# Patient Record
Sex: Male | Born: 1980 | Race: Black or African American | Hispanic: No | Smoking: Current every day smoker
Health system: Southern US, Community
[De-identification: ages and names within clinical notes are randomized; demographics above are authoritative.]

## PROBLEM LIST (undated history)

## (undated) DIAGNOSIS — Z8673 Personal history of transient ischemic attack (TIA), and cerebral infarction without residual deficits: Secondary | ICD-10-CM

## (undated) DIAGNOSIS — Z8669 Personal history of other diseases of the nervous system and sense organs: Secondary | ICD-10-CM

## (undated) DIAGNOSIS — Z720 Tobacco use: Secondary | ICD-10-CM

## (undated) DIAGNOSIS — D696 Thrombocytopenia, unspecified: Secondary | ICD-10-CM

## (undated) DIAGNOSIS — G459 Transient cerebral ischemic attack, unspecified: Secondary | ICD-10-CM

## (undated) DIAGNOSIS — J939 Pneumothorax, unspecified: Secondary | ICD-10-CM

## (undated) HISTORY — PX: SPLENECTOMY: SUR1306

---
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
## 2002-07-27 ENCOUNTER — Emergency Department: Admit: 2002-07-27 | Payer: Self-pay | Source: Emergency Department | Admitting: Emergency Medicine

## 2003-03-06 ENCOUNTER — Emergency Department: Admit: 2003-03-06 | Payer: Self-pay | Source: Emergency Department | Admitting: Internal Medicine

## 2004-07-14 ENCOUNTER — Emergency Department: Admit: 2004-07-14 | Payer: Self-pay | Source: Emergency Department | Admitting: Emergency Medicine

## 2005-01-27 ENCOUNTER — Emergency Department: Admit: 2005-01-27 | Payer: Self-pay | Source: Emergency Department | Admitting: Pediatric Emergency Medicine

## 2005-01-29 ENCOUNTER — Emergency Department: Admit: 2005-01-29 | Payer: Self-pay | Source: Emergency Department | Admitting: Internal Medicine

## 2005-01-31 ENCOUNTER — Emergency Department: Admit: 2005-01-31 | Payer: Self-pay | Source: Ambulatory Visit | Admitting: Emergency Medicine

## 2005-04-26 ENCOUNTER — Emergency Department
Admit: 2005-04-26 | Payer: Self-pay | Source: Emergency Department | Attending: Emergency Medicine | Admitting: Emergency Medicine

## 2005-06-02 ENCOUNTER — Emergency Department: Admit: 2005-06-02 | Payer: Self-pay | Source: Emergency Department | Admitting: Emergency Medicine

## 2006-04-06 ENCOUNTER — Emergency Department: Admit: 2006-04-06 | Payer: Self-pay | Source: Emergency Department | Admitting: Physician Assistant

## 2006-05-05 ENCOUNTER — Inpatient Hospital Stay
Admission: EM | Admit: 2006-05-05 | Disposition: A | Discharge status: Home / Self Care | Payer: Self-pay | Source: Emergency Department | Admitting: Internal Medicine

## 2006-05-05 LAB — COMPREHENSIVE METABOLIC PANEL - AH CERNER
ALT: 22 U/L (ref 0–41)
AST (SGOT): 37 U/L (ref 0–37)
Albumin/Globulin Ratio: 1.3 (ref 1.1–2.2)
Albumin: 3.9 g/dL (ref 3.4–4.8)
Alkaline Phosphatase: 79 U/L (ref 40–129)
Anion Gap: 8 mEq/L (ref 5–15)
BUN: 15 mg/dL (ref 6–20)
Bilirubin, Total: 0.4 mg/dL (ref 0.0–1.0)
CA: 3.9 mEq/L (ref 3.8–4.6)
CO2: 25.8 mEq/L (ref 22.0–29.0)
Calcium: 8.7 mg/dL (ref 8.4–10.2)
Chloride: 106 mEq/L (ref 96–108)
Creatinine: 1.2 mg/dL (ref 0.5–1.2)
Globulin: 2.9 g/dL (ref 2.0–3.6)
Glucose: 99 mg/dL
Osmolality Calculated: 291 mosm/kg (ref 282–298)
Potassium: 4.3 mEq/L (ref 3.3–5.1)
Protein, Total: 6.8 g/dL (ref 6.4–8.3)
Sodium: 140 mEq/L (ref 135–145)
UN/CREA SOFT: 13 RATIO (ref 6–33)

## 2006-05-05 LAB — CBC WITH AUTO DIFFERENTIAL CERNER
Basophils Absolute: 0 /mm3 (ref 0.0–0.2)
Basophils: 0 % (ref 0–2)
Eosinophils Absolute: 0.1 /mm3 (ref 0.0–0.2)
Eosinophils: 1 % (ref 0–5)
Granulocytes Absolute: 5.4 /mm3 (ref 1.8–8.1)
Hematocrit: 41.1 % — ABNORMAL LOW (ref 42.0–52.0)
Hgb: 13.8 G/DL (ref 13.0–17.0)
Lymphocytes Absolute: 1.9 /mm3 (ref 0.5–4.4)
Lymphocytes: 23 % (ref 15–41)
MCH: 30.7 PG (ref 28.0–32.0)
MCHC: 33.6 G/DL (ref 32.0–36.0)
MCV: 91.2 FL (ref 80.0–100.0)
MPV: 7.2 FL — ABNORMAL LOW (ref 7.4–10.4)
Monocytes Absolute: 0.6 /mm3 (ref 0.0–1.2)
Monocytes: 8 % (ref 0–11)
Neutrophils %: 68 % (ref 52–75)
Platelets: 13 /mm3 — CR (ref 140–400)
RBC: 4.51 /mm3 — ABNORMAL LOW (ref 4.70–6.00)
RDW: 14.4 % (ref 11.5–15.0)
WBC: 7.9 /mm3 (ref 3.5–10.8)

## 2006-05-05 LAB — URINALYSIS WITH MICROSCOPIC
Bilirubin, UA: NEGATIVE
Blood, UA: NEGATIVE
Ketones UA: NEGATIVE
Leukocyte Esterase, UA: NEGATIVE
Nitrite, UA: NEGATIVE
Protein, UR: NEGATIVE
RBC, UA: 3 /HPF (ref 0–3)
Specific Gravity UA POCT: 1.011 (ref 1.005–1.030)
Urine pH: 5 (ref 4.6–8.0)
Urobilinogen, UA: 2 EU/dL — ABNORMAL HIGH (ref 0.2–1.0)
WBC, UA: 2 /HPF (ref 0–5)

## 2006-05-05 LAB — TYPE AND SCREEN: AB Screen Gel: NEGATIVE

## 2006-05-05 LAB — HEMOLYSIS INDEX: Hemolysis Index: 87 Units

## 2006-05-05 LAB — GFR

## 2006-05-06 LAB — COMPREHENSIVE METABOLIC PANEL - AH CERNER
ALT: 14 U/L (ref 0–41)
AST (SGOT): 20 U/L (ref 0–37)
Albumin/Globulin Ratio: 1.4 (ref 1.1–2.2)
Albumin: 3.7 g/dL (ref 3.4–4.8)
Alkaline Phosphatase: 72 U/L (ref 40–129)
Anion Gap: 9 mEq/L (ref 5–15)
BUN: 18 mg/dL (ref 6–20)
Bilirubin, Total: 0.2 mg/dL (ref 0.0–1.0)
CA: 4.2 mEq/L (ref 3.8–4.6)
CO2: 23.1 mEq/L (ref 22.0–29.0)
Calcium: 9 mg/dL (ref 8.4–10.2)
Chloride: 106 mEq/L (ref 96–108)
Creatinine: 1.2 mg/dL (ref 0.5–1.2)
Globulin: 2.6 g/dL (ref 2.0–3.6)
Glucose: 113 mg/dL
Osmolality Calculated: 289 mosm/kg (ref 282–298)
Potassium: 4.3 mEq/L (ref 3.3–5.1)
Protein, Total: 6.3 g/dL — ABNORMAL LOW (ref 6.4–8.3)
Sodium: 138 mEq/L (ref 135–145)
UN/CREA SOFT: 15 RATIO (ref 6–33)

## 2006-05-06 LAB — CBC WITH AUTO DIFFERENTIAL CERNER
Basophils Absolute: 0 /mm3 (ref 0.0–0.2)
Basophils: 0 % (ref 0–2)
Eosinophils Absolute: 0 /mm3 (ref 0.0–0.2)
Eosinophils: 0 % (ref 0–5)
Granulocytes Absolute: 5.9 /mm3 (ref 1.8–8.1)
Hematocrit: 39.3 % — ABNORMAL LOW (ref 42.0–52.0)
Hgb: 13.3 G/DL (ref 13.0–17.0)
Lymphocytes Absolute: 1.5 /mm3 (ref 0.5–4.4)
Lymphocytes: 19 % (ref 15–41)
MCH: 30.8 PG (ref 28.0–32.0)
MCHC: 33.8 G/DL (ref 32.0–36.0)
MCV: 91 FL (ref 80.0–100.0)
MPV: 7.1 FL — ABNORMAL LOW (ref 7.4–10.4)
Monocytes Absolute: 0.7 /mm3 (ref 0.0–1.2)
Monocytes: 8 % (ref 0–11)
Neutrophils %: 72 % (ref 52–75)
Platelets: 47 /mm3 — ABNORMAL LOW (ref 140–400)
RBC: 4.32 /mm3 — ABNORMAL LOW (ref 4.70–6.00)
RDW: 14.4 % (ref 11.5–15.0)
WBC: 8.1 /mm3 (ref 3.5–10.8)

## 2006-05-06 LAB — HEMOLYSIS INDEX: Hemolysis Index: 12 Units

## 2007-02-23 ENCOUNTER — Emergency Department: Admit: 2007-02-23 | Payer: Self-pay | Source: Emergency Department | Admitting: Emergency Medicine

## 2007-02-23 LAB — URINALYSIS WITH MICROSCOPIC
Bilirubin, UA: NEGATIVE
Blood, UA: NEGATIVE
Glucose, UA: NEGATIVE
Ketones UA: NEGATIVE
Nitrite, UA: NEGATIVE
Protein, UR: NEGATIVE
Specific Gravity UA POCT: 1.014 (ref 1.005–1.030)
Urine pH: 5 (ref 4.6–8.0)
Urobilinogen, UA: 1 EU/dL (ref 0.2–1.0)

## 2007-02-23 LAB — CBC WITH AUTO DIFFERENTIAL CERNER
Basophils Absolute: 0 /mm3 (ref 0.0–0.2)
Basophils: 0 % (ref 0–2)
Eosinophils Absolute: 0.1 /mm3 (ref 0.0–0.2)
Eosinophils: 2 % (ref 0–5)
Granulocytes Absolute: 3.7 /mm3 (ref 1.8–8.1)
Hematocrit: 39.5 % — ABNORMAL LOW (ref 42.0–52.0)
Hgb: 13.2 G/DL (ref 13.0–17.0)
Lymphocytes Absolute: 4.2 /mm3 (ref 0.5–4.4)
Lymphocytes: 49 % — ABNORMAL HIGH (ref 15–41)
MCH: 29.2 PG (ref 28.0–32.0)
MCHC: 33.4 G/DL (ref 32.0–36.0)
MCV: 87.4 FL (ref 80.0–100.0)
MPV: 8.6 FL (ref 7.4–10.4)
Monocytes Absolute: 0.5 /mm3 (ref 0.0–1.2)
Monocytes: 6 % (ref 0–11)
Neutrophils %: 43 % — ABNORMAL LOW (ref 52–75)
Platelets: 75 /mm3 — ABNORMAL LOW (ref 140–400)
RBC: 4.51 /mm3 — ABNORMAL LOW (ref 4.70–6.00)
RDW: 14.4 % (ref 11.5–15.0)
WBC: 8.5 /mm3 (ref 3.5–10.8)

## 2007-02-23 LAB — COMPREHENSIVE METABOLIC PANEL - AH CERNER
ALT: 24 U/L (ref 0–41)
AST (SGOT): 34 U/L (ref 0–37)
Albumin/Globulin Ratio: 1.5 (ref 1.1–2.2)
Albumin: 4.3 g/dL (ref 3.4–4.8)
Alkaline Phosphatase: 77 U/L (ref 40–129)
Anion Gap: 13 mEq/L (ref 5–15)
BUN: 16 mg/dL (ref 6–20)
Bilirubin, Total: 0.4 mg/dL (ref 0.0–1.0)
CA: 3.8 mEq/L (ref 3.8–4.6)
CO2: 23.1 mEq/L (ref 22.0–29.0)
Calcium: 8.8 mg/dL (ref 8.4–10.2)
Chloride: 105 mEq/L (ref 96–108)
Creatinine: 1.1 mg/dL (ref 0.5–1.2)
Globulin: 2.9 g/dL (ref 2.0–3.6)
Glucose: 73 mg/dL
Osmolality Calculated: 292 mosm/kg (ref 282–298)
Potassium: 3.9 mEq/L (ref 3.3–5.1)
Protein, Total: 7.2 g/dL (ref 6.4–8.3)
Sodium: 141 mEq/L (ref 133–145)
UN/CREA SOFT: 15 RATIO (ref 6–33)

## 2007-02-23 LAB — GFR

## 2007-02-23 LAB — PT AND APTT
PT INR: 1.1 {INR} (ref 0.9–1.1)
PT: 12.9 s (ref 10.8–13.3)
PTT: 29 s (ref 21–32)

## 2007-02-23 LAB — HEMOLYSIS INDEX: Hemolysis Index: 6 Units

## 2007-02-23 LAB — LIPASE: Lipase: 17 U/L (ref 13–60)

## 2007-03-09 ENCOUNTER — Emergency Department: Admit: 2007-03-09 | Payer: Self-pay | Source: Emergency Department | Admitting: Emergency Medicine

## 2007-12-07 LAB — COMPREHENSIVE METABOLIC PANEL - AH CERNER
ALT: 21 U/L (ref 0–41)
AST (SGOT): 29 U/L (ref 0–37)
Albumin/Globulin Ratio: 1.5 (ref 1.1–2.2)
Albumin: 4.3 g/dL (ref 3.4–4.8)
Alkaline Phosphatase: 70 U/L (ref 40–129)
Anion Gap: 11 mEq/L (ref 5–15)
BUN: 11 mg/dL (ref 6–20)
Bilirubin, Total: 0.5 mg/dL (ref 0.0–1.0)
CA: 4 mEq/L (ref 3.8–4.6)
CO2: 24.7 mEq/L (ref 22.0–29.0)
Calcium: 9.1 mg/dL (ref 8.4–10.2)
Chloride: 104 mEq/L (ref 96–108)
Creatinine: 1 mg/dL (ref 0.5–1.2)
Globulin: 2.8 g/dL (ref 2.0–3.6)
Glucose: 91 mg/dL (ref 70–100)
Osmolality Calculated: 289 mosm/kg (ref 282–298)
Potassium: 3.8 mEq/L (ref 3.3–5.1)
Protein, Total: 7.1 g/dL (ref 6.4–8.3)
Sodium: 140 mEq/L (ref 133–145)
UN/CREA SOFT: 11 RATIO (ref 6–33)

## 2007-12-07 LAB — CBC AND DIFFERENTIAL
Basophils Absolute: 0 /mm3 (ref 0.0–0.2)
Basophils: 0 % (ref 0–2)
Eosinophils Absolute: 0 /mm3 (ref 0.0–0.7)
Eosinophils: 0 % (ref 0–5)
Granulocytes Absolute: 4.8 /mm3 (ref 1.8–8.1)
Hematocrit: 38.2 % — ABNORMAL LOW (ref 42.0–52.0)
Hgb: 13 G/DL (ref 13.0–17.0)
Immature Granulocytes Absolute: 0 CUMM (ref 0.0–0.0)
Immature Granulocytes: 0 % (ref 0–1)
Lymphocytes Absolute: 1.9 /mm3 (ref 0.5–4.4)
Lymphocytes: 26 % (ref 15–41)
MCH: 29.7 PG (ref 28.0–32.0)
MCHC: 34 G/DL (ref 32.0–36.0)
MCV: 87.4 FL (ref 80.0–100.0)
MPV: 10.4 FL (ref 9.4–12.3)
Monocytes Absolute: 0.6 /mm3 (ref 0.0–1.2)
Monocytes: 8 % (ref 0–11)
Neutrophils %: 66 % (ref 52–75)
Platelets: 376 /mm3 (ref 140–400)
RBC: 4.37 /mm3 — ABNORMAL LOW (ref 4.70–6.00)
RDW: 13.9 % (ref 11.5–15.0)
WBC: 7.28 /mm3 (ref 3.50–10.80)

## 2007-12-07 LAB — PT AND APTT
PT INR: 1.1 {INR} (ref 0.9–1.1)
PT: 13.6 s — ABNORMAL HIGH (ref 10.8–13.3)
PTT: 26 s (ref 21–32)

## 2007-12-07 LAB — GFR

## 2007-12-07 LAB — HEMOLYSIS INDEX: Hemolysis Index: 7 Units

## 2007-12-08 ENCOUNTER — Inpatient Hospital Stay
Admission: EM | Admit: 2007-12-08 | Disposition: A | Discharge status: Home / Self Care | Payer: Self-pay | Source: Emergency Department | Admitting: Internal Medicine

## 2007-12-08 LAB — CBC
Hematocrit: 38.8 % — ABNORMAL LOW (ref 42.0–52.0)
Hgb: 12.7 G/DL — ABNORMAL LOW (ref 13.0–17.0)
MCH: 29 PG (ref 28.0–32.0)
MCHC: 32.7 G/DL (ref 32.0–36.0)
MCV: 88.6 FL (ref 80.0–100.0)
MPV: 10.5 FL (ref 9.4–12.3)
Platelets: 368 /mm3 (ref 140–400)
RBC: 4.38 /mm3 — ABNORMAL LOW (ref 4.70–6.00)
RDW: 14 % (ref 11.5–15.0)
WBC: 13.55 /mm3 — ABNORMAL HIGH (ref 3.50–10.80)

## 2007-12-08 LAB — BASIC METABOLIC PANEL - AH CERNER
Anion Gap: 8 mEq/L (ref 5–15)
BUN: 12 mg/dL (ref 6–20)
CO2: 27.6 mEq/L (ref 22.0–29.0)
Calcium: 8.5 mg/dL (ref 8.4–10.2)
Chloride: 104 mEq/L (ref 96–108)
Creatinine: 0.9 mg/dL (ref 0.5–1.2)
Glucose: 60 mg/dL — ABNORMAL LOW (ref 70–100)
Osmolality Calculated: 288 mosm/kg (ref 282–298)
Potassium: 3.8 mEq/L (ref 3.3–5.1)
Sodium: 140 mEq/L (ref 133–145)
UN/CREA SOFT: 13 RATIO (ref 6–33)

## 2007-12-08 LAB — URINALYSIS
Bilirubin, UA: NEGATIVE
Blood, UA: NEGATIVE
Glucose, UA: NEGATIVE
Ketones UA: NEGATIVE
Leukocyte Esterase, UA: NEGATIVE
Nitrite, UA: NEGATIVE
Protein, UR: NEGATIVE
Specific Gravity UA POCT: 1.015 (ref 1.005–1.030)
Urine pH: 5.5 (ref 4.6–8.0)
Urobilinogen, UA: 2 EU/dL — ABNORMAL HIGH (ref 0.2–1.0)

## 2007-12-08 LAB — HEMOLYSIS INDEX: Hemolysis Index: 4 Units

## 2007-12-08 LAB — CKMB - AH CERNER: MB ACS: 6.73 NG/ML (ref 0.00–7.20)

## 2007-12-09 LAB — CBC
Hematocrit: 40.1 % — ABNORMAL LOW (ref 42.0–52.0)
Hgb: 13.7 G/DL (ref 13.0–17.0)
MCH: 29.6 PG (ref 28.0–32.0)
MCHC: 34.2 G/DL (ref 32.0–36.0)
MCV: 86.6 FL (ref 80.0–100.0)
MPV: 9.8 FL (ref 9.4–12.3)
Platelets: 327 /mm3 (ref 140–400)
RBC: 4.63 /mm3 — ABNORMAL LOW (ref 4.70–6.00)
RDW: 13.5 % (ref 11.5–15.0)
WBC: 17.44 /mm3 — ABNORMAL HIGH (ref 3.50–10.80)

## 2007-12-09 LAB — GFR

## 2007-12-09 LAB — COMPREHENSIVE METABOLIC PANEL - AH CERNER
ALT: 17 U/L (ref 0–41)
AST (SGOT): 19 U/L (ref 0–37)
Albumin/Globulin Ratio: 1.4 (ref 1.1–2.2)
Albumin: 3.4 g/dL (ref 3.4–4.8)
Alkaline Phosphatase: 65 U/L (ref 40–129)
Anion Gap: 9 mEq/L (ref 5–15)
BUN: 11 mg/dL (ref 6–20)
Bilirubin, Total: 0.8 mg/dL (ref 0.0–1.0)
CA: 4 mEq/L (ref 3.8–4.6)
CO2: 24.1 mEq/L (ref 22.0–29.0)
Calcium: 8.3 mg/dL — ABNORMAL LOW (ref 8.4–10.2)
Chloride: 104 mEq/L (ref 96–108)
Creatinine: 1 mg/dL (ref 0.5–1.2)
Globulin: 2.5 g/dL (ref 2.0–3.6)
Glucose: 82 mg/dL (ref 70–100)
Osmolality Calculated: 282 mosm/kg (ref 282–298)
Potassium: 3.7 mEq/L (ref 3.3–5.1)
Protein, Total: 5.9 g/dL — ABNORMAL LOW (ref 6.4–8.3)
Sodium: 137 mEq/L (ref 133–145)
UN/CREA SOFT: 11 RATIO (ref 6–33)

## 2007-12-09 LAB — HEMOLYSIS INDEX: Hemolysis Index: 3 Units

## 2007-12-10 LAB — CBC AND DIFFERENTIAL
Basophils Absolute: 0 /mm3 (ref 0.0–0.2)
Basophils: 0 % (ref 0–2)
Eosinophils Absolute: 0.1 /mm3 (ref 0.0–0.7)
Eosinophils: 1 % (ref 0–5)
Granulocytes Absolute: 5.2 /mm3 (ref 1.8–8.1)
Hematocrit: 39.4 % — ABNORMAL LOW (ref 42.0–52.0)
Hgb: 13.2 G/DL (ref 13.0–17.0)
Immature Granulocytes Absolute: 0 CUMM (ref 0.0–0.0)
Immature Granulocytes: 0 % (ref 0–1)
Lymphocytes Absolute: 2.8 /mm3 (ref 0.5–4.4)
Lymphocytes: 31 % (ref 15–41)
MCH: 29.1 PG (ref 28.0–32.0)
MCHC: 33.5 G/DL (ref 32.0–36.0)
MCV: 86.8 FL (ref 80.0–100.0)
MPV: 10 FL (ref 9.4–12.3)
Monocytes Absolute: 1 /mm3 (ref 0.0–1.2)
Monocytes: 11 % (ref 0–11)
Neutrophils %: 57 % (ref 52–75)
Platelets: 226 /mm3 (ref 140–400)
RBC: 4.54 /mm3 — ABNORMAL LOW (ref 4.70–6.00)
RDW: 13.8 % (ref 11.5–15.0)
WBC: 9.04 /mm3 (ref 3.50–10.80)

## 2007-12-11 LAB — TYPE AND SCREEN: AB Screen Gel: NEGATIVE

## 2007-12-11 LAB — URINALYSIS
Bilirubin, UA: NEGATIVE
Blood, UA: NEGATIVE
Glucose, UA: NEGATIVE
Ketones UA: NEGATIVE
Leukocyte Esterase, UA: NEGATIVE
Nitrite, UA: NEGATIVE
Protein, UR: NEGATIVE
Specific Gravity UA POCT: 1.017 (ref 1.005–1.030)
Urine pH: 8 (ref 4.6–8.0)
Urobilinogen, UA: 0.2 EU/dL (ref 0.2–1.0)

## 2007-12-11 LAB — CHOLESTEROL, TOTAL: Cholesterol: 130 mg/dL (ref 100–180)

## 2007-12-11 LAB — HEMOLYSIS INDEX: Hemolysis Index: 20 Units

## 2007-12-12 LAB — BASIC METABOLIC PANEL - AH CERNER
Anion Gap: 9 mEq/L (ref 5–15)
BUN: 17 mg/dL (ref 6–20)
CO2: 26.3 mEq/L (ref 22.0–29.0)
Calcium: 8.9 mg/dL (ref 8.4–10.2)
Chloride: 104 mEq/L (ref 96–108)
Creatinine: 1.1 mg/dL (ref 0.5–1.2)
Glucose: 98 mg/dL (ref 70–100)
Osmolality Calculated: 290 mosm/kg (ref 282–298)
Potassium: 4.2 mEq/L (ref 3.3–5.1)
Sodium: 139 mEq/L (ref 133–145)
UN/CREA SOFT: 15 RATIO (ref 6–33)

## 2007-12-12 LAB — URINALYSIS
Bilirubin, UA: NEGATIVE
Blood, UA: NEGATIVE
Glucose, UA: NEGATIVE
Ketones UA: NEGATIVE
Leukocyte Esterase, UA: NEGATIVE
Nitrite, UA: NEGATIVE
Protein, UR: NEGATIVE
Specific Gravity UA POCT: 1.029 (ref 1.005–1.030)
Urine pH: 5 (ref 4.6–8.0)
Urobilinogen, UA: 1 EU/dL (ref 0.2–1.0)

## 2007-12-12 LAB — CBC
Hematocrit: 39.9 % — ABNORMAL LOW (ref 42.0–52.0)
Hgb: 13.7 G/DL (ref 13.0–17.0)
MCH: 30 PG (ref 28.0–32.0)
MCHC: 34.3 G/DL (ref 32.0–36.0)
MCV: 87.5 FL (ref 80.0–100.0)
MPV: 10.2 FL (ref 9.4–12.3)
Platelets: 272 /mm3 (ref 140–400)
RBC: 4.56 /mm3 — ABNORMAL LOW (ref 4.70–6.00)
RDW: 13.7 % (ref 11.5–15.0)
WBC: 7.92 /mm3 (ref 3.50–10.80)

## 2007-12-12 LAB — HEMOLYSIS INDEX: Hemolysis Index: 2 Units

## 2007-12-12 LAB — GFR

## 2007-12-12 LAB — HIV AG/AB 4TH GENERATION: HIV 1/2 Antibody: NONREACTIVE

## 2007-12-13 LAB — BASIC METABOLIC PANEL - AH CERNER
Anion Gap: 9 mEq/L (ref 5–15)
BUN: 12 mg/dL (ref 6–20)
CO2: 26.5 mEq/L (ref 22.0–29.0)
Calcium: 8.4 mg/dL (ref 8.4–10.2)
Chloride: 101 mEq/L (ref 96–108)
Creatinine: 1 mg/dL (ref 0.5–1.2)
Glucose: 99 mg/dL (ref 70–100)
Osmolality Calculated: 282 mosm/kg (ref 282–298)
Potassium: 4 mEq/L (ref 3.3–5.1)
Sodium: 136 mEq/L (ref 133–145)
UN/CREA SOFT: 12 RATIO (ref 6–33)

## 2007-12-13 LAB — CBC AND DIFFERENTIAL
Basophils Absolute: 0 /mm3 (ref 0.0–0.2)
Basophils: 0 % (ref 0–2)
Eosinophils Absolute: 0.2 /mm3 (ref 0.0–0.7)
Eosinophils: 2 % (ref 0–5)
Granulocytes Absolute: 7.1 /mm3 (ref 1.8–8.1)
Hematocrit: 38.2 % — ABNORMAL LOW (ref 42.0–52.0)
Hgb: 12.9 G/DL — ABNORMAL LOW (ref 13.0–17.0)
Immature Granulocytes Absolute: 0 CUMM (ref 0.0–0.0)
Immature Granulocytes: 0 % (ref 0–1)
Lymphocytes Absolute: 2.6 /mm3 (ref 0.5–4.4)
Lymphocytes: 24 % (ref 15–41)
MCH: 29.7 PG (ref 28.0–32.0)
MCHC: 33.8 G/DL (ref 32.0–36.0)
MCV: 87.8 FL (ref 80.0–100.0)
MPV: 9.7 FL (ref 9.4–12.3)
Monocytes Absolute: 1.1 /mm3 (ref 0.0–1.2)
Monocytes: 10 % (ref 0–11)
Neutrophils %: 64 % (ref 52–75)
Platelets: 255 /mm3 (ref 140–400)
RBC: 4.35 /mm3 — ABNORMAL LOW (ref 4.70–6.00)
RDW: 13.6 % (ref 11.5–15.0)
WBC: 10.99 /mm3 — ABNORMAL HIGH (ref 3.50–10.80)

## 2007-12-13 LAB — HEMOLYSIS INDEX: Hemolysis Index: 3 Units

## 2011-05-08 LAB — ECG 12-LEAD
Atrial Rate: 66 {beats}/min
P Axis: 49 degrees
P-R Interval: 112 ms
Q-T Interval: 378 ms
QRS Duration: 94 ms
QTC Calculation (Bezet): 396 ms
R Axis: 81 degrees
T Axis: 73 degrees
Ventricular Rate: 66 {beats}/min

## 2011-05-27 NOTE — Op Note (Signed)
Account Number: [id number]      Document ID: [id number]      Admit Date: 12/08/2007      Procedure Date: 12/12/2007            Patient Location: A2707-01      Patient Type: I            SURGEON: Vivia Birmingham MD      ASSISTANT:                  PREOPERATIVE DIAGNOSIS:      Right spontaneous pneumothorax with persistent air leak.            POSTOPERATIVE DIAGNOSIS:      Right spontaneous pneumothorax with persistent air leak.            OPERATIVE PROCEDURES:      Right thoracoscopy, apical bleb resection, mechanical pleurodesis.            ASSISTANT:      Dena Billet, RNFA.            ANESTHESIA:      General.            INDICATIONS FOR PROCEDURE:      The patient is a 30 year old gentleman in previously good health.  He was      admitted through the emergency room with complaints of cough and      right-sided chest pain, found to have a large right pneumothorax.  Chest      tube was placed in the emergency room.  He had a persistent air leak.  He      now presents for right thoracoscopy and apical bleb resection.  His      preoperative CAT scan of the chest did show bilateral apical blebs.            DESCRIPTION OF PROCEDURE:      Patient was brought to the operating room and placed in the supine      position.  Adequate general anesthesia was administered.  He was then      placed with the right side supreme and the right chest prepped with      Betadine, draped in usual sterile fashion.  The old chest tube was removed      prior to prepping and draping.  Three thoracoscopic portal incisions were      made.  Thoracoscope was introduced.  There were no pleural adhesions.  A      ruptured apical bleb was identified in the apical aspect of the right upper      lobe.  Using an endoscopic linear stapler, an apical bleb resection was      performed, and the specimen was removed through one of the thoracoscopic      portal incisions.   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            Using a bovie scratch pad, a mechanical pleurodesis was then performed of      the upper two-thirds of the chest wall, including the apex.  Adequate      hemostasis was noted.  A pleural chest tube was placed through the inferior      thoracoscopic portal incision.  The other two incisions were closed in      layers using running absorbable suture.  The wounds were dressed in the      usual sterile fashion.  The old chest tube space was closed with 2      interrupted silk sutures.  There wounds were all dressed in usual sterile      fashion.  The patient was taken back to the ICU in stable condition.      Sponge and needle counts were reported as correct.                        Electronic Signing Provider      _______________________________     Date/Time Signed: _____________      Vivia Birmingham MD [phone])            D:  12/12/2007 14:27 PM by Vivia Birmingham, MD [phone])      T:  12/13/2007 09:23 AM by [email or web address]          Everlean Cherry: [id number]) (Doc ID: [id number])                  cc:Dr. Normajean Glasgow

## 2011-05-27 NOTE — Progress Notes (Signed)
Nurse Practitioner - Comprehensive Progress Note            PERMANENT            12/12/2007 18:40                        [facility]            [facility]                        [name], Todd Coldwell.                        Date of Service 12/12/2007 18:40                        Patient Description 30 yo BM  s/p VATS/ Bleb resection and mechanical      pleurodesis. PMH significant for spontaneous pneumthorax                        HPI/Events of Note Cardiovascular and thoracic surgery- rounds with Dr. Leander Rams            s/p R+ VATs/ bleb resection/ mech pleuradesis, DOS            Pt arrieved from OR, awake, with NC 4L/min, ct x1- no air leak, vss                                    Physical Exam HR-72, sinus, (range 52-87), BP-120/64 (range 129-170/55-74),      Temp-36.9(C)            Resp Rate-16, spontaneous, (range 12-17), O2 Sat%-98 (range 98-100), FiO2%-4L      NC            Appearance: ill-appearing, well developed, not in acute distress, c/o pain      post op - dilaudid given with good results            Weight: admission-77.2; I&O: intake-410, output-117, dialysis net (0), Total      net (+293)            NEURO-Mental Status: somnolent, calm/appropriate, oriented x3, move all      extremeties, NAD; GCS: M-6, V-5, E-4; Motor: normal            HEAD/NECK-Pupils: equal; Conjunctivae: non-icteric; Neck: normal mobility,      non-tender, JVD absent            PULM-Airway: not intubated; clear to auscultation; pCXR- no ptx, lungs clear            CVASC-S1 normal; S2 normal; Pulses: normal            CHEST/BACK-chest tube(s) - R; ct x1- dressing CDI, no SQ emphysema, no air      leak, mild amount of bloody drainage            GI-no organomegaly; no masses; no pain on palpation; Bowel Sounds: decreased            GU-voids            EXTREM-no edema;Perfusion: adequate  Abnormal Hct - 39.9                        Normal Hgb - 13.7, WBC - 7.92, Plts - 272             Na - 139, K - 4.2, Cl - 104, HCO3 - 26.3, Glu - 98, BUN - 17, Cr - 1.1, Ca -      8.9                        Medications CEFUROXIME SOD 1.5 GM VIAL 1500 MG Q8H-S 2 IV (12/13/2007)            Heparin Sodium 5000 Unit/mL Vial 5,000 UNIT     . Q12H-S S SQ (12/13/2007)            FAMOTIDINE 20 MG TABLET 20 MG Q12H-S S PO (12/08/2007)            DOK 100 MG CAPSULE 100 MG BID S PO (12/12/2007)                                    ASSESSMENT                        Diagnoses                        PULM: S/P THORACOTOMY-FOR EMPHYSEMTAOUS BLEB                             post op protocol            increase activity as tolerated            ct x1 to LCS, f/u cxr            wean FIO2 as tolerated                        NEURO: pain-moderate                             cont. dilaudid prn                        Treatments                        SURG: analgesics            stress ulcer prophylaxis            prophylactic antibacterials-for surgery            oxygen therapy (< 40%)            chest tube            wound care                        Global Issues possible d/c 12/14/07 in AM                                                Electronically  Signed by: Ulla Potash, [phone] Garth Schlatter (NP)

## 2011-05-27 NOTE — Progress Notes (Signed)
Nurse - Brief Progress Note            PERMANENT            12/12/2007 14:33                        [facility]            [facility]                        [name], Todd Ozburn.                        Date of Service 12/12/2007 14:33                        HPI/Events of Note Admit to eICU                                    eICU Interventions Minor-Communication with other healthcare providers and/or      family                                                Electronically Signed by: Jackalyn Lombard (RN)

## 2011-05-27 NOTE — Discharge Summary (Signed)
Account Number: [id number]      Document ID: [id number]      Admit Date: 12/08/2007      Discharge Date: 12/14/2007            ATTENDING PHYSICIAN:  Vivia Birmingham, MD                  DIAGNOSES:      1.  Right pneumothorax.      2.  Splenectomy.      3.  Anemia.      4.  Positive tobacco use.            OPERATIVE PROCEDURES:      Dec 12, 2007, right thoracoscopy, apical bleb resection, and mechanical      pleurodesis.            HOSPITAL COURSE:      The patient is a 30 year old gentleman who presented to the emergency room      with complaints of cough and right-sided chest pain.  He was found to have      a large right pneumothorax and chest tube was placed in the emergency room.       He had a persisting air leak, which did not resolve after several days.      He had a CT scan of the chest, which showed bilateral apical blebs.  He was      referred for right thoracoscopy and apical bleb resection.  He underwent      the above procedure, tolerated it well, and was admitted to [facility]      postoperatively, extubated, stable, with no pneumothorax on chest x-ray.      He had 1 chest tube dissection with no air leak.  He was advanced to water      seal on the morning of postoperative day 1.  Followup chest x-ray showed no      pneumothorax and chest tube was removed in the evening of postoperative day      1.  Postoperative day 2, followup chest ____ showed no pneumothorax.  The      patient was hemodynamically stable.  Incision was clean and dry without      erythema.  Oxygen saturation was 100% on room air.  The patient was      discharged to home.            DISCHARGE MEDICATIONS:      1.  Dilaudid 2 mg p.o. every 4 hours p.r.n. pain.      2.  Colace 100 mg p.o. b.i.d.            DISCHARGE INSTRUCTIONS:      Patient was instructed to return to clinic on Dec 21, 2007.  He was also      instructed to follow up with his primary care physician.                        Electronic Signing Provider      _______________________________      Date/Time Signed: _____________      Vivia Birmingham MD [phone])            D:  12/29/2007 15:58 PM by Laureen Abrahams, NP [phone])      T:  12/29/2007 18:34 PM by [email or web address]          (Conf: [id number]) (Doc ID: [id number])  [id number] Salli Quarry MD

## 2011-05-27 NOTE — Consults (Signed)
Account Number: [id number]      Document ID: [id number]      Admit Date: 12/08/2007      Service Date: 12/09/2007            Patient Location: A2707-01      Patient Type: I            CONSULTING PROVIDER: Vivia Birmingham MD            REFERRING PHYSICIAN:                  HISTORY OF PRESENT ILLNESS:      The patient was seen in thoracic surgery for consultation at the request of      Dr. Normajean Glasgow for right spontaneous pneumothorax.  Recommendation for      providing workup and management.  The patient is a 30 year old gentleman      with a history of smoking.  He was admitted through the emergency room on      Dec 07, 2007 with a 4 day history of cough and right sided chest pain.      Chest x-ray demonstrated a large right pneumothorax, and a chest tube was      placed in the emergency room.  He now presents for recommendations      regarding further workup and management.            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  He      also has a history of some anemia.  He also has history of substance abuse      with cocaine in the past.            MEDICATIONS:      He is on no medications at home.            ALLERGIES:      No known drug allergies.            FAMILY HISTORY:      Negative.            SOCIAL HISTORY:      He is a smoker of 1 pack per day for 15 years.  Alcohol use is minimal.            REVIEW OF SYSTEMS:      HEENT:  No difficulty swallowing, dentures.      PULMONARY:  No prior history of lung disease, asthma or shortness of      breath.  CARDIAC:  No chest pain, claudication.      GASTROENTEROLOGY:  No ulcers or blood in the stool, black stool.      GENITOURINARY:  No kidney disease or difficulty urinating.      NEUROLOGIC:  No stroke, TIA, weakness, numbness.      MUSCULOSKELETAL:  No artificial joints, infections.      HEMATOLOGIC:  No diabetes or thyroid disease.            PHYSICAL EXAMINATION:      GENERAL:  Demonstrated a well developed, well  nourished gentleman not in      acute distress.      VITAL SIGNS:  Afebrile, heart rate 54, respirations 20, blood pressure      117/69.  O2 saturation 97% on room air.      SKIN:  Warm and dry.      HEENT:  Normocephalic.  Pupils equal.  Extraocular movements intact.  Sclerae:  White membranes, moist.      NECK:  Supple.  Normal carotid upstroke bilaterally, no JVD or mass.      LUNGS:  Clear.  Right-sided chest tube in place.  Positive air leak.      HEART:  Regular, no murmur.      ABDOMEN:  Soft, nontender, normal bowel sounds.      EXTREMITIES:  Without edema, warm and dry.  On the abdomen, he did have      previously well-healed scars from previous splenectomy.      NEUROLOGICAL:  Alert, oriented x3, with symmetric motor function, normal      speech, normal gait.            LABORATORY DATA:      Electrolytes are normal, creatinine 1.0, BUN 11, hemoglobin 13, white blood      cell count 7.28, and platelets 376,000.  Chest x-ray showed a small apical      pneumothorax with a well-placed chest tube.            IMPRESSION:      1.  Right spontaneous pneumothorax.  First episode.  With persistent air      leak.      2.  History of substance abuse, cocaine in the past.      3.  Smoking.      4.  Prior splenectomy for thrombocytopenia.            PLAN:      I discussed the situation with the patient.  Based on my exam today, my      review of his diagnostic studies, I recommend continued medical management      with a chest tube and suction.  We will obtain a noncontrast CT scan of the      chest to rule out apical blebs.  If the air leak persists or if this      pneumothorax recurs, will recommend thoracoscopic bleb resection and      pleurodesis.  Indications for the surgery, operative procedure itself,      expected postoperative course, surgical risks all discussed.  The patient      understands the above and is agreeable with this plan.                        Electronic Signing Provider       _______________________________     Date/Time Signed: _____________      Vivia Birmingham MD [phone])            D:  12/12/2007 14:32 PM by Vivia Birmingham, MD [phone])      T:  12/12/2007 19:22 PM by [id number]          Everlean Cherry: [id number]) (Doc ID: [id number])                  cc:Dr.Yohannes

## 2016-02-23 ENCOUNTER — Observation Stay: Payer: Medicaid Other

## 2016-02-23 ENCOUNTER — Emergency Department: Payer: Medicaid Other

## 2016-02-23 ENCOUNTER — Observation Stay
Admission: EM | Admit: 2016-02-23 | Discharge: 2016-02-25 | Disposition: A | Discharge status: Home / Self Care | Payer: Medicaid Other | Attending: Internal Medicine | Admitting: Internal Medicine

## 2016-02-23 ENCOUNTER — Observation Stay: Payer: Self-pay | Admitting: Internal Medicine

## 2016-02-23 DIAGNOSIS — R29810 Facial weakness: Secondary | ICD-10-CM | POA: Insufficient documentation

## 2016-02-23 DIAGNOSIS — R200 Anesthesia of skin: Secondary | ICD-10-CM | POA: Insufficient documentation

## 2016-02-23 DIAGNOSIS — H579 Unspecified disorder of eye and adnexa: Secondary | ICD-10-CM

## 2016-02-23 DIAGNOSIS — M6281 Muscle weakness (generalized): Secondary | ICD-10-CM

## 2016-02-23 DIAGNOSIS — Z8669 Personal history of other diseases of the nervous system and sense organs: Secondary | ICD-10-CM

## 2016-02-23 DIAGNOSIS — Z8673 Personal history of transient ischemic attack (TIA), and cerebral infarction without residual deficits: Secondary | ICD-10-CM | POA: Insufficient documentation

## 2016-02-23 DIAGNOSIS — F17210 Nicotine dependence, cigarettes, uncomplicated: Secondary | ICD-10-CM | POA: Insufficient documentation

## 2016-02-23 DIAGNOSIS — Z823 Family history of stroke: Secondary | ICD-10-CM | POA: Insufficient documentation

## 2016-02-23 DIAGNOSIS — G43109 Migraine with aura, not intractable, without status migrainosus: Principal | ICD-10-CM | POA: Insufficient documentation

## 2016-02-23 DIAGNOSIS — R253 Fasciculation: Secondary | ICD-10-CM | POA: Insufficient documentation

## 2016-02-23 DIAGNOSIS — R531 Weakness: Secondary | ICD-10-CM

## 2016-02-23 DIAGNOSIS — I639 Cerebral infarction, unspecified: Secondary | ICD-10-CM | POA: Insufficient documentation

## 2016-02-23 DIAGNOSIS — Z720 Tobacco use: Secondary | ICD-10-CM

## 2016-02-23 HISTORY — DX: Personal history of other diseases of the nervous system and sense organs: Z86.69

## 2016-02-23 HISTORY — DX: Tobacco use: Z72.0

## 2016-02-23 HISTORY — DX: Personal history of transient ischemic attack (TIA), and cerebral infarction without residual deficits: Z86.73

## 2016-02-23 LAB — MAGNESIUM: Magnesium: 2 mg/dL (ref 1.6–2.6)

## 2016-02-23 LAB — COMPREHENSIVE METABOLIC PANEL
ALT: 28 U/L (ref 0–55)
AST (SGOT): 27 U/L (ref 5–34)
Albumin/Globulin Ratio: 1 (ref 0.9–2.2)
Albumin: 3.9 g/dL (ref 3.5–5.0)
Alkaline Phosphatase: 67 U/L (ref 38–106)
Anion Gap: 11 (ref 5.0–15.0)
BUN: 10.9 mg/dL (ref 9.0–28.0)
Bilirubin, Total: 0.4 mg/dL (ref 0.2–1.2)
CO2: 26 mEq/L (ref 22–29)
Calcium: 9.6 mg/dL (ref 8.5–10.5)
Chloride: 106 mEq/L (ref 100–111)
Creatinine: 1.3 mg/dL (ref 0.7–1.3)
Globulin: 3.9 g/dL — ABNORMAL HIGH (ref 2.0–3.6)
Glucose: 119 mg/dL — ABNORMAL HIGH (ref 70–100)
Potassium: 4.4 mEq/L (ref 3.5–5.1)
Protein, Total: 7.8 g/dL (ref 6.0–8.3)
Sodium: 143 mEq/L (ref 136–145)

## 2016-02-23 LAB — APTT: PTT: 29 s (ref 23–37)

## 2016-02-23 LAB — CBC AND DIFFERENTIAL
Basophils Absolute Automated: 0.03 10*3/uL (ref 0.00–0.20)
Basophils Automated: 0.5 %
Eosinophils Absolute Automated: 0.02 10*3/uL (ref 0.00–0.70)
Eosinophils Automated: 0.3 %
Hematocrit: 42.3 % (ref 42.0–52.0)
Hgb: 14.2 g/dL (ref 13.0–17.0)
Immature Granulocytes Absolute: 0.01 10*3/uL
Immature Granulocytes: 0.2 %
Lymphocytes Absolute Automated: 1.73 10*3/uL (ref 0.50–4.40)
Lymphocytes Automated: 28.5 %
MCH: 31.3 pg (ref 28.0–32.0)
MCHC: 33.6 g/dL (ref 32.0–36.0)
MCV: 93.4 fL (ref 80.0–100.0)
MPV: 9.2 fL — ABNORMAL LOW (ref 9.4–12.3)
Monocytes Absolute Automated: 0.4 10*3/uL (ref 0.00–1.20)
Monocytes: 6.6 %
Neutrophils Absolute: 3.9 10*3/uL (ref 1.80–8.10)
Neutrophils: 64.1 %
Platelets: 165 10*3/uL (ref 140–400)
RBC: 4.53 10*6/uL — ABNORMAL LOW (ref 4.70–6.00)
RDW: 15 % (ref 12–15)
WBC: 6.08 10*3/uL (ref 3.50–10.80)

## 2016-02-23 LAB — ECG 12-LEAD
Atrial Rate: 96 {beats}/min
P Axis: 83 degrees
P-R Interval: 136 ms
Q-T Interval: 334 ms
QRS Duration: 86 ms
QTC Calculation (Bezet): 421 ms
R Axis: 57 degrees
T Axis: 50 degrees
Ventricular Rate: 96 {beats}/min

## 2016-02-23 LAB — LIPID PANEL
Cholesterol / HDL Ratio: 2.6
Cholesterol: 161 mg/dL (ref 0–199)
HDL: 61 mg/dL (ref 40–9999)
LDL Calculated: 91 mg/dL (ref 0–99)
Triglycerides: 47 mg/dL (ref 34–149)
VLDL Calculated: 9 mg/dL — ABNORMAL LOW (ref 10–40)

## 2016-02-23 LAB — TROPONIN I: Troponin I: 0.01 ng/mL (ref 0.00–0.09)

## 2016-02-23 LAB — PT/INR
PT INR: 0.9
PT: 12.5 s — ABNORMAL LOW (ref 12.6–15.0)

## 2016-02-23 LAB — GLUCOSE WHOLE BLOOD - POCT: Whole Blood Glucose POCT: 122 mg/dL — ABNORMAL HIGH (ref 70–100)

## 2016-02-23 LAB — HEMOGLOBIN A1C
Average Estimated Glucose: 96.8 mg/dL
Hemoglobin A1C: 5 % (ref 4.6–5.9)

## 2016-02-23 LAB — GFR: EGFR: >60

## 2016-02-23 LAB — PHOSPHORUS: Phosphorus: 3 mg/dL (ref 2.3–4.7)

## 2016-02-23 LAB — ETHANOL: Alcohol: NOT DETECTED mg/dL

## 2016-02-23 LAB — HEMOLYSIS INDEX: Hemolysis Index: 13 (ref 0–18)

## 2016-02-23 MED ORDER — BUTALBITAL-APAP-CAFFEINE 50-325-40 MG PO TABS
1.0000 | ORAL_TABLET | Freq: Four times a day (QID) | ORAL | Status: DC | PRN
Start: 2016-02-23 — End: 2016-02-24
  Administered 2016-02-23 – 2016-02-24 (×2): 1 via ORAL
  Filled 2016-02-23 (×2): qty 1

## 2016-02-23 MED ORDER — ACETAMINOPHEN 325 MG PO TABS
650.0000 mg | ORAL_TABLET | ORAL | Status: DC | PRN
Start: 2016-02-23 — End: 2016-02-25
  Administered 2016-02-23 – 2016-02-24 (×2): 650 mg via ORAL
  Filled 2016-02-23 (×3): qty 2

## 2016-02-23 MED ORDER — KETOROLAC TROMETHAMINE 15 MG/ML IJ SOLN
15.0000 mg | Freq: Once | INTRAMUSCULAR | Status: AC
Start: 2016-02-23 — End: 2016-02-23
  Administered 2016-02-23: 15 mg via INTRAVENOUS
  Filled 2016-02-23: qty 1

## 2016-02-23 MED ORDER — NICOTINE 21 MG/24HR TD PT24
1.0000 | MEDICATED_PATCH | Freq: Every day | TRANSDERMAL | Status: DC
Start: 2016-02-23 — End: 2016-02-25
  Administered 2016-02-23 – 2016-02-25 (×3): 1 via TRANSDERMAL
  Filled 2016-02-23 (×4): qty 1

## 2016-02-23 MED ORDER — BUTALBITAL-APAP-CAFFEINE 50-325-40 MG PO TABS
2.0000 | ORAL_TABLET | Freq: Once | ORAL | Status: AC
Start: 2016-02-23 — End: 2016-02-23
  Administered 2016-02-23: 2 via ORAL
  Filled 2016-02-23: qty 2

## 2016-02-23 MED ORDER — ASPIRIN 325 MG PO TBEC
325.0000 mg | DELAYED_RELEASE_TABLET | Freq: Every day | ORAL | Status: DC
Start: 2016-02-24 — End: 2016-02-25
  Administered 2016-02-24 – 2016-02-25 (×2): 325 mg via ORAL
  Filled 2016-02-23 (×2): qty 1

## 2016-02-23 MED ORDER — MORPHINE SULFATE 2 MG/ML IJ/IV SOLN (WRAP)
2.0000 mg | Freq: Once | Status: AC
Start: 2016-02-23 — End: 2016-02-23
  Administered 2016-02-23: 2 mg via INTRAVENOUS
  Filled 2016-02-23: qty 1

## 2016-02-23 MED ORDER — ATORVASTATIN CALCIUM 10 MG PO TABS
10.0000 mg | ORAL_TABLET | Freq: Every evening | ORAL | Status: DC
Start: 2016-02-23 — End: 2016-02-25
  Administered 2016-02-23 – 2016-02-24 (×2): 10 mg via ORAL
  Filled 2016-02-23 (×2): qty 1

## 2016-02-23 MED ORDER — ONDANSETRON HCL 4 MG/2ML IJ SOLN
4.0000 mg | Freq: Three times a day (TID) | INTRAMUSCULAR | Status: DC | PRN
Start: 2016-02-23 — End: 2016-02-25
  Administered 2016-02-23: 4 mg via INTRAVENOUS
  Filled 2016-02-23: qty 2

## 2016-02-23 MED ORDER — ASPIRIN 81 MG PO CHEW
324.0000 mg | CHEWABLE_TABLET | Freq: Once | ORAL | Status: AC
Start: 2016-02-23 — End: 2016-02-23
  Administered 2016-02-23: 324 mg via ORAL
  Filled 2016-02-23: qty 4

## 2016-02-23 NOTE — Plan of Care (Signed)
Problem: Safety  Goal: Patient will be free from injury during hospitalization  Outcome: Progressing    02/23/16 2008   Goal/Interventions addressed this shift   Patient will be free from injury during hospitalization  Assess patient's risk for falls and implement fall prevention plan of care per policy;Provide and maintain safe environment;Use appropriate transfer methods;Ensure appropriate safety devices are available at the bedside;Include patient/ family/ care giver in decisions related to safety;Assess for patients risk for elopement and implement Elopement Risk Plan per policy;Hourly rounding   Call bell in reach. Non skid slippers on.  Goal: Patient will be free from infection during hospitalization  Outcome: Progressing    02/23/16 2008   Goal/Interventions addressed this shift   Free from Infection during hospitalization Assess and monitor for signs and symptoms of infection         Problem: Pain  Goal: Pain at adequate level as identified by patient  Outcome: Progressing    02/23/16 2008   Goal/Interventions addressed this shift   Pain at adequate level as identified by patient Identify patient comfort function goal;Assess pain on admission, during daily assessment and/or before any "as needed" intervention(s);Reassess pain within 30-60 minutes of any procedure/intervention, per Pain Assessment, Intervention, Reassessment (AIR) Cycle;Assess for risk of opioid induced respiratory depression, including snoring/sleep apnea. Alert healthcare team of risk factors identified.;Evaluate if patient comfort function goal is met;Evaluate patient's satisfaction with pain management progress;Offer non-pharmacological pain management interventions;Consult/collaborate with Physical Therapy, Occupational Therapy, and/or Speech Therapy         Problem: Side Effects from Pain Analgesia  Goal: Patient will experience minimal side effects of analgesic therapy  Outcome: Progressing    02/23/16 2008   Goal/Interventions  addressed this shift   Patient will experience minimal side effects of analgesic therapy Monitor/assess patient's respiratory status (RR depth, effort, breath sounds);Assess for changes in cognitive function;Prevent/manage side effects per LIP orders (i.e. nausea, vomiting, pruritus, constipation, urinary retention, etc.);Evaluate for opioid-induced sedation with appropriate assessment tool (i.e. POSS)         Problem: Discharge Barriers  Goal: Patient will be discharged home or other facility with appropriate resources  Outcome: Progressing    02/23/16 2008   Goal/Interventions addressed this shift   Discharge to home or other facility with appropriate resources Provide appropriate patient education         Problem: Psychosocial and Spiritual Needs  Goal: Demonstrates ability to cope with hospitalization/illness  Outcome: Progressing    02/23/16 2008   Goal/Interventions addressed this shift   Demonstrates ability to cope with hospitalizations/illness Encourage verbalization of feelings/concerns/expectations;Provide quiet environment   Pt resting quietly in bed with lights dimmed.    Problem: Day of Admission - Stroke  Goal: Core/Quality measure requirements - Admission  Outcome: Progressing    02/23/16 2008   Goal/Interventions addressed this shift   Core/Quality measure requirements - Admission VTE Prevention: Ensure anticoagulant(s) administered and/or anti-embolism stockings/devices documented as ordered;Ensure antithrombotic administered or contraindication documented by LIP;Begin stroke education on admission (must include Modifiable Risk Factors, Warning Signs and Symptoms of Stroke, Activation of Emergency Medical System and Follow-up Appointments) Ensure handout has been given and documented.;Ensure PT/OT and/or SLP ordered

## 2016-02-23 NOTE — ED Provider Notes (Signed)
Physician/Midlevel provider first contact with patient: 02/23/16 1213         History     Chief Complaint   Patient presents with   . Numbness     HPI Comments: Patient is brought into emergency department complaining of difficulty closing left eye beginning last night with subsequent numbness of left shoulder, arm, leg, foot the day today.  Patient endorses a previous history of "two mini strokes" within the last 3 years.  He states that he was briefly hospitalized, no interventions were done.  Denies chronic deficits from previous CVA/transient ischemic attack.  He saw a neurologist as an outpatient.  Endorses daily tobacco use.  Endorses periodic alcohol use without illicit drug use.  Denies chest pain, shortness of breath.      Patient is a 35 y.o. male presenting with strokes. The history is provided by the patient and a relative. No language interpreter was used.   Cerebrovascular Accident  Presenting symptoms: headaches, sensory loss, visual change and weakness    Presenting symptoms: no change in level of consciousness, no confusion, no disturbances in coordination, no language symptoms and no loss of balance    Headaches:     Severity:  Mild    Onset quality:  Gradual    Duration:  18 hours    Timing:  Constant    Progression:  Unchanged    Chronicity:  Recurrent  Sensory loss:     Location:  L upper extremity and L lower extremity    Severity:  Mild  Visual Change:     Location:  L eye    Quality: decreased vision      Onset quality:  Unable to specify    Severity:  Unable to specify    Progression:  Resolved    Chronicity:  New  Weakness:     Severity:  Mild    Onset quality:  Unable to specify    Timing:  Constant    Progression:  Unchanged    Chronicity:  New  Onset quality:  Sudden  Last known well:  5PM yesterday   Timing:  Constant  Progression:  Unchanged  Similar to previous episodes: no    Associated symptoms: paresthesias    Associated symptoms: no chest pain, no trouble swallowing, no  dizziness, no facial pain, no fall, no fever, no hearing loss, no neck pain, no seizures, no tinnitus, no vertigo and no vomiting             Past Medical History   Diagnosis Date   . Hx-TIA (transient ischemic attack)    . History of migraine    . Tobacco abuse        History reviewed. No pertinent past surgical history.    History reviewed. No pertinent family history.    Social  Social History   Substance Use Topics   . Smoking status: Current Every Day Smoker -- 1.50 packs/day     Types: Cigarettes   . Smokeless tobacco: None   . Alcohol Use: Yes       .     No Known Allergies    Home Medications     Last Medication Reconciliation Action:  Pharmacy Completed [facility] 02/23/2016  2:23 PM          No Medications           Review of Systems   Constitutional: Positive for activity change. Negative for fever, chills, diaphoresis, appetite change, fatigue and unexpected weight change.  HENT: Negative for hearing loss, tinnitus and trouble swallowing.    Eyes: Positive for visual disturbance. Negative for photophobia, pain, redness and itching.   Respiratory: Negative.    Cardiovascular: Negative.  Negative for chest pain.   Gastrointestinal: Negative.  Negative for vomiting.   Endocrine: Negative.    Genitourinary: Negative.    Musculoskeletal: Negative.  Negative for neck pain.   Neurological: Positive for weakness, numbness, headaches and paresthesias. Negative for dizziness, vertigo, tremors, seizures, syncope, facial asymmetry, speech difficulty, light-headedness, disturbances in coordination and loss of balance.   Psychiatric/Behavioral: Negative.  Negative for confusion.   All other systems reviewed and are negative.      Physical Exam    BP: 149/70 mmHg, Heart Rate: 100, Temp: 97.3 F (36.3 C), Resp Rate: 17, SpO2: 98 %, Weight: 90.719 kg    Physical Exam   Constitutional: He appears well-developed and well-nourished. He is active.  Non-toxic appearance. He does not have a sickly appearance. He does  not appear ill. No distress.   NAD, conversant, well appearing     HENT:   Head: Normocephalic.   Eyes: Conjunctivae, EOM and lids are normal. Lids are everted and swept, no foreign bodies found. Right eye exhibits normal extraocular motion and no nystagmus. Left eye exhibits normal extraocular motion and no nystagmus.   Neck: Neck supple.   Cardiovascular: Normal rate, regular rhythm, S1 normal, S2 normal, normal heart sounds, intact distal pulses and normal pulses.  Exam reveals no gallop and no friction rub.    No murmur heard.  Pulmonary/Chest: Effort normal and breath sounds normal. No respiratory distress. He has no decreased breath sounds. He has no wheezes. He has no rhonchi. He has no rales. He exhibits no tenderness.   Abdominal: Soft. Bowel sounds are normal. He exhibits no shifting dullness, no distension, no pulsatile liver, no fluid wave, no abdominal bruit, no ascites, no pulsatile midline mass and no mass. There is no tenderness.   Musculoskeletal: Normal range of motion.   Neurological: He is alert. He is not disoriented. He displays no atrophy, no tremor and normal reflexes. A sensory deficit is present. No cranial nerve deficit. He exhibits abnormal muscle tone. He displays no seizure activity. Coordination and gait normal. GCS eye subscore is 4. GCS verbal subscore is 5. GCS motor subscore is 6.   Patient with 4/5 strength to left upper and lower extremity compared with 5/5 strength to right upper and lower extremity.  Diminished soft touch sensation to left side throughout surface area of extremities that is intact, "although less" in the right unaffected side.  There is normal finger to nose, normal heel-to-shin.  No ataxia.  No slurring of speech   Skin: Skin is warm. No rash noted. He is not diaphoretic. No erythema. No pallor.   Psychiatric: He has a normal mood and affect. His behavior is normal. Judgment and thought content normal.   Nursing note and vitals reviewed.        MDM and ED  Course     ED Medication Orders     Start Ordered     Status Ordering Provider    02/23/16 1329 02/23/16 1328  morphine injection 2 mg   Once     Route: Intravenous  Ordered Dose: 2 mg     Last MAR action:  Given Aryona Sill EDWIN    02/23/16 1329 02/23/16 1328  aspirin chewable tablet 324 mg   Once     Route: Oral  Ordered Dose:  324 mg     Last MAR action:  Given Dvora Buitron EDWIN    02/23/16 1328 02/23/16 1328  ondansetron (ZOFRAN) injection 4 mg   Every 8 hours PRN     Route: Intravenous  Ordered Dose: 4 mg     Last MAR action:  Given Kien Mirsky EDWIN             MDM  Number of Diagnoses or Management Options  Left arm numbness: new and requires workup  Left eye complaint: new and requires workup  Left leg numbness: new and requires workup  Diagnosis management comments:   I, Adelene Idler, Physician Assistant, have been the primary provider for this pt during their ED stay.     The attending signature signifies review and agreement of the history, physical examination, evaluation, clinical impression and plan except as noted.     02 sat is 98 percent on   ra, which is nml.     Patient with approximately 18 hours of symptoms including left ocular numbness/nonspecific vision disturbance with left arm and leg weakness and numbness.    Symptoms have been going on for greater than 12 hours and the patient is not a TPA candidate and it will not be administered.  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    On record review from [facility], negative head CT performed 3 days ago, there is some concern for alcohol abuse in the note.    We will check a head CT, screening labs including EtOH level.    Lab/Rad results reviewed include:     Labs Reviewed  CBC AND DIFFERENTIAL - Abnormal; Notable for the following:      RBC                           4.53 (*)               MPV                           9.2 (*)             All other components within normal limits  COMPREHENSIVE METABOLIC PANEL  - Abnormal; Notable for the following:      Glucose                       119 (*)                Globulin                      3.9 (*)             All other components within normal limits  PT/INR - Abnormal; Notable for the following:      PT                            12.5 (*)            All other components within normal limits  LIPID PANEL - Abnormal; Notable for the following:      VLDL Cholesterol Cal          9 (*)               All other components within normal limits  GLUCOSE WHOLE BLOOD - POCT -  Abnormal; Notable for the following:      POCT - Glucose Whole blood    122 (*)             All other components within normal limits  APTT  TROPONIN I  GFR  ETHANOL  MAGNESIUM  PHOSPHORUS  HEMOGLOBIN A1C  HEMOLYSIS INDEX    US Carotid Duplex Doppler Comp   Final Result     No abnormal findings.        Wynema Birch, MD     02/23/2016 6:17 PM         Chest AP Portable   Final Result     No acute finding.        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                             Einar Pheasant, MD     02/23/2016 12:16 PM         MRI Brain WO Contrast    (Results Pending)  Echocardiogram Adult Comp W Clr/Dop/Bubble    (Results Pending)  MRA Head WO Contrast    (Results Pending)    EKG is normal sinus rhythm, ventricular rate of 96 bpm.  No ST changes or axis deviation.  No STEMI.  EKG interpreted by a physician.    Stroke, likely from several days ago or older.  Visible on CT.  We will admit for MRI and evaluation.  Patient with unchanged exam on recheck.    Admitted to Hospitalist.    *This note was generated by the Epic EMR system/ Dragon speech recognition and may contain inherent errors or omissions not intended by the user. Grammatical errors, random word insertions, deletions, pronoun errors and incomplete sentences are occasional consequences of this technology due to software limitations. Not all errors are  caught or corrected. If there are questions or concerns about the content of this note or information contained within the body of this dictation they should be addressed directly with the author for clarification             Amount and/or Complexity of Data Reviewed  Clinical lab tests: reviewed and ordered  Tests in the radiology section of CPT: reviewed and ordered  Tests in the medicine section of CPT: ordered and reviewed  Obtain history from someone other than the patient: yes  Review and summarize past medical records: yes  Discuss the patient with other providers: yes    Risk of Complications, Morbidity, and/or Mortality  Presenting problems: high  Diagnostic procedures: high  Management options: high    Patient Progress  Patient progress: stable            Procedures    Clinical Impression & Disposition     Clinical Impression  Final diagnoses:   Left arm numbness   Left leg numbness   Left eye complaint        ED Disposition     Observation Admitting Physician: Bertrum Sol [[id number]]  Diagnosis: Left-sided weakness [604540]  Estimated Length of Stay: < 2 midnights  Tentative Discharge Plan?: Home or Self Care [1]  Patient Class: Observation [104]             There are no  discharge medications for this patient.                  Jamse Arn, [place]  02/23/16 2158

## 2016-02-23 NOTE — UM Notes (Signed)
Place (admit) for Observation Services (Order [id number]) 02/23/16    Emergency Department  02/23/16  Chief Complaint   Patient presents with   .  Numbness     HPI Comments: Patient is brought into emergency department complaining of difficulty closing left eye beginning last night with subsequent numbness of left shoulder, arm, leg, foot the day today.  Patient endorses a previous history of "two mini strokes" within the last 3 years.    Vital Signs BP: 149/70 mmHg, Heart Rate: 100, Temp: 97.3 F (36.3 C), Resp Rate: 17, SpO2: 98 %, Weight: 90.719 kg, pain 8-9/10    ED Medications - Morphine 2 mg IV, Zofran 4 mg IV, ASA 324 mg PO  H&P  Left-sided weakness, numbness and tingling with left-sided facial droop, left eye twitching and left-sided headache with mild dizziness, most likely related to CVA vs new TIA. Patient was just seen at [facility] 4 days ago and diagnosed with a transient ischemic attack and CT head today is showing small old right cerebellar cortical infarction.  No family history of stroke.  The patient does abuse tobacco, but no known diabetes, hypertension or hyperlipidemia.  Continue to rule out acute CVA, but symptoms could also be related to a complex migraine      Stroke protocol.      MRI of the brain and MRA of the head with and without contrast secondary to patient's young age.      Carotid Dopplers        Will get echocardiogram with bubble study.      Check Lipid panel, but start on low dose Lipitor due to positive infarct on CT scan        Patient was not in any aspirin at home. We will put on full dose for now.      Monitor telemetry.      Neuro checks.      PT, OT      IVF    2. 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  Was not put on any anticoagulants or antiplatelets then    3. Tobacco abuse, smoked a pack a day since he was 35 years old. Encouraged smoking cessation and will return nicotine  patch while here    4. Apparently diagnosed with complex migraines by Faythe Dingwall. Patient still complaining of a left-sided headache with light sensitivity.        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       MRAngiogram Head is pending  Past Medical History   Diagnosis Date   . Hx-TIA (transient ischemic attack)    . History of migraine    . Tobacco abuse      History reviewed. No pertinent past surgical history.    Orders - diet mechanical soft, echocardiogram, MR Brain wo contrast, MRA head WO contrast, VS q 4 hrs w/ pulse,  Neurocks q 4 hours, Telemetry

## 2016-02-23 NOTE — Plan of Care (Signed)
[email or web address] Program    Education about New Medications and their Side effects    Dear [name],    Its been a pleasure taking care of you during your hospitalization here at [facility]. We have initiated a new program to educate our patients and/or their family members or designated personnel about the new medications started by your physicians and their indications along with the possible side effects. Multiple studies have shown that patients started on new medications are often unaware of the names of the medication along with the indications and their side effects which leads to decreased compliance with the medications.    During our conversation today on 02/23/2016  9:43 PM I have explained to you the name of the new medication and the indication along with some possible common side effects. Listed below are some of the new medications started during this hospitalization.     Please call the Nurse if you have any side effects while in hospital.     Please call 911 if you have any life threatening symptoms after you are discharged from the hospital.    Please inform your Primary care physician for common side effects which are not life threatening after discharge.      Medication Name: Atorvastatin(Lipitor)   This Medication is used for:   Heart Attack   Stroke   High Cholesterol    Common Side Effects are:   Diarrhea    Headache/Dizziness    Muscle Pain   Elevated liver function tests    A note from your nurse:  Call your nurse immediately if you notice itching, hives, swelling or trouble breathing       Thank you for your time.    Orvilla Cornwall, RN  02/23/2016  9:43 PM  [facility]  [address]  [place], [place]  [address]

## 2016-02-23 NOTE — ED Notes (Signed)
TOR-BSST: The [place] Bedside Swallowing Screening Test      Time of Evaluation: __1405__    A) Is the patient alert and able to participate? If NO mark results (D) as failed.     Yes or No ___Yes______    B) Before water intake:      1.  Have patient say "ah" and judge voice quality    Abnormal or Normal: _____Normal______  2.  Ask patient to stick tongue out and then move it from side to side.    Abnormal or Normal: _____Normal_____    If anything in section (A) is abnormal do not continue on to section (B)    C)  Water intake:    Have the patient sit upright and give water.  Ask patient to say "ah" after each intake.  Mark as abnormal if you note any of the following signs: coughing, change in voice quality or drooling.  If abnormal, stop water intake and advance to "D."     1.  Give one teaspoon of water at a time.  Stop immediately for any abnormal signs as listed above.  If swallowing normally, repeat for a total of 10 teaspoons.    Abnormalities noted:  ________    If yes, which swallow? _______    If yes, what was the abnormality? ______________________      If no, continue the test.    C) Cup Drinking: If all 10 teaspoons were swallowed normally.  Allow the patient to drink from the cup.  Wait 1 minute after the end of the water swallows.  Ask patient to say "ah" and judge voice quality.    Abnormal or Normal: ____Normal____    D)  Results:  No abnormal signs (pass) or 1 or more abnormal signs (fail) ___Pass__

## 2016-02-23 NOTE — H&P (Signed)
Todd [name] HOSPITALIST  H&P    Patient Info:   Date/Time: 02/23/2016 / 3:25 PM   Admit Date:02/23/2016  Patient Name:Todd [name]   ZOX:[id number]   PCP: Christa See, MD  Attending Physician:Alemayehu, Harrell Gave, MD     Assessment and Plan:     1.  Left-sided weakness, numbness and tingling with left-sided facial droop, left eye twitching and left-sided headache with mild dizziness, most likely related to CVA vs new TIA. 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  No family history of stroke.  The patient does abuse tobacco, but no known diabetes, hypertension or hyperlipidemia.  Continue to rule out acute CVA, but symptoms could also be related to a complex migraine   Stroke protocol.   MRI of the brain and MRA of the head with and without contrast secondary to patient's young age.   Carotid Dopplers    Will get echocardiogram with bubble study.   Check Lipid panel, but start on low dose Lipitor due to positive infarct on CT scan    Patient was not in any aspirin at home. We will put on full dose for now.   Monitor telemetry.   Neuro checks.   PT, OT   IVF    2. 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  Was not put on any anticoagulants or antiplatelets then    3. Tobacco abuse, smoked a pack a day since he was 35 years old. Encouraged smoking cessation and will return nicotine patch while here    4. Apparently diagnosed with complex migraines by Faythe Dingwall. Patient still complaining of a left-sided headache with light sensitivity.    Will order one-time dose of Fioricet and as needed as we cannot treat with any triptan secondary to ruling out acute CVA for now      DVT Prohylaxis: SCD's   Code Status: Full Code  Disposition: Home  Type of Admission:Observation  Estimated Length of Stay  (including stay in the ER receiving treatment): less than 2 midnights   Medical Necessity for stay: Stroke work up   Clinical Presentation:   History of Presenting Illness:   Todd [name] is a 35 y.o. male who has history of History reviewed. No pertinent past surgical history. Past Medical History   Diagnosis Date   . Hx-TIA (transient ischemic attack)    . History of migraine      This pleasant African-American 35 year old male with past medical history see above comes into the ER complaining of new stroke signs and symptoms.  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 Does not plan any antiplatelets or anticoagulation then. He says 4 days ago he was having a left-sided headache with left eye twitching and photosensitivity so he went to [facility] for where he ruled out for acute CVA, but was diagnosed with a transient ischemic attack but apparently not put on any aspirin then.  Last night while was watching a movie with his mother, he started having again the left-sided headache with left eye twitching like he could not close his eye, but he thought it was related to stress, so he went to bed. When he woke up the morning, he did  not have the symptoms, but he just felt "off".  He went to work and when he got to work, he again started having the left-sided headache with left eye twitching and when he went to lunch at [employer] around noon time.  His coworker noted that he had a left sided facial droop and patient started complaining of left-sided arm and leg weakness with numbness and tingling on his left face, arm and leg. He has no more droop or eye twitching issues, but still feels numb on his left side with mild focal weakness noted.  He denies any other recent illnesses or recent travel. Family history of CVA and denies personal history of hypertension, hyperlipidemia or diabetes.  Patient is not a TPA  candidate due to being out of the TPA window.       Review of Systems:   A comprehensive review of systems was negative except for the underlying highlighted in Bold     Constitutional: chills, weight loss, malaise/fatigue and diaphoresis.   HENT: hearing loss, ear pain, nosebleeds, congestion, sore throat, neck pain, tinnitus and ear discharge.   Eyes: blurred vision, double vision, photophobia, pain, discharge and redness.   Respiratory: cough, hemoptysis, sputum production, shortness of breath, wheezing and stridor.   Cardiovascular: chest pain, palpitations, orthopnea, claudication, leg swelling and PND.   Gastrointestinal: heartburn, nausea, vomiting, abdominal pain, diarrhea, constipation, blood in stool and melena.   Genitourinary: dysuria, urgency, frequency, hematuria and flank pain.   Musculoskeletal: myalgias, back pain, joint pain and falls.   Skin: itching and rash.   Neurological: dizziness, tingling, tremors, sensory change, speech change, focal weakness, seizures, loss of consciousness, weakness and headaches.   Endo/Heme/Allergies: environmental allergies and polydipsia. Does not bruise/bleed easily.   Psychiatric/Behavioral: depression, suicidal ideas, hallucinations, memory loss and substance abuse. The patient is not nervous/anxious and does not have insomnia.     Physical Exam:     Filed Vitals:    02/23/16 1209 02/23/16 1343 02/23/16 1409 02/23/16 1459   BP: 149/70 142/80 138/74 131/82   Pulse: 100 79 73 73   Temp: 97.3 F (36.3 C) 97 F (36.1 C) 96.8 F (36 C) 97.6 F (36.4 C)   TempSrc:  Temporal Artery Temporal Artery Temporal Artery   Resp: 17 17 15 18    Height: 1.88 m (6\' 2" )      Weight: 90.719 kg (200 lb)      SpO2: 98% 100% 98% 98%       Constitutional: Patient is oriented to person, place, and time. Patient appears well-developed and well-nourished.   Head: Normocephalic and atraumatic.  Eyes- pupils equal and reactive, extraocular eye movements intact, sclera anicteric.   Photosensitivity noted and mild vertical bilateral nystagmus  Nose - normal and patent, no erythema, discharge or polyps and normal nontender sinuses  Mouth - mucous membranes moist, pharynx normal without lesions  Neck: Normal range of motion. Neck supple. No JVD present. No bruit or thrill noted on bilateral carotids.  Cardiovascular: Normal rate, regular rhythm, normal heart sounds and intact distal pulses. Exam reveals no gallop and no friction rub. No murmur heard.  Pulmonary/Chest: Effort normal and breath sounds normal. No stridor. No respiratory distress. Patient has no wheezes. No crackles were present. Exhibits no tenderness on palpation.   Abdominal: Soft. Bowel sounds are normal. Patient exhibits no distension and no mass was palpable. There is no tenderness. There is no rebound and no guarding.   Musculoskeletal: Normal range of motion. Patient exhibits no edema and  no tenderness. 4/5 left sided muscle strength and 5/5 right sided muscle strength   Lymphadenopathy: Patient has no cervical adenopathy.   Neurological: Patient is alert and oriented to person, place, and time and has normal reflexes. No cranial nerve deficit. Normal muscle tone.  No notable facial droop, but decreased sensation on left side on his face, arm and leg.  Finger to nose bilaterally intact.  No ataxia noted  Skin: Skin is warm. No rash noted. Patient is not diaphoretic. No erythema. No pallor.   Psychiatric: Has normal mood and affect. Behavior is normal. Judgment and thought content normal    Physical Exam  Clinical Information and History:   Chief Complaint:  Chief Complaint   Patient presents with   . Numbness     Past Medical History:  Past Medical History   Diagnosis Date   . Hx-TIA (transient ischemic attack)    . History of migraine      Past Surgical History:History reviewed. No pertinent past surgical history.  Family History:History reviewed. No pertinent family history. No family history of stroke, hypertension or  diabetes  Social History:  History   Alcohol Use   . Yes     History   Drug Use No     History   Smoking status   . Current Every Day Smoker -- 1.50 packs/day   . Types: Cigarettes   Smokeless tobacco   . Not on file     Social History     Social History   . Marital Status: Legally Separated     Spouse Name: N/A   . Number of Children: N/A   . Years of Education: N/A     Social History Main Topics   . Smoking status: Current Every Day Smoker -- 1.50 packs/day     Types: Cigarettes   . Smokeless tobacco: None   . Alcohol Use: Yes   . Drug Use: No   . Sexual Activity: Not Asked     Other Topics Concern   . None     Social History Narrative   . None     Allergies:No Known Allergies  Medications:  No prescriptions prior to admission     Results of Labs/imaging   Labs have been reviewed:   Coagulation Profile:   Recent Labs  Lab 02/23/16  1229   PT 12.5*   PT INR 0.9   PTT 29       CBC review:   Recent Labs  Lab 02/23/16  1229   WBC 6.08   HGB 14.2   HEMATOCRIT 42.3   PLATELETS 165   MCV 93.4   RDW 15   NEUTROPHILS 64.1   LYMPHOCYTES AUTOMATED 28.5   EOSINOPHILS AUTOMATED 0.3   IMMATURE GRANULOCYTE 0.2   NEUTROPHILS ABSOLUTE 3.90   ABSOLUTE IMMATURE GRANULOCYTE 0.01     Chem Review:    Results     Procedure Component Value Units Date/Time    Lipid panel [[id number]] Collected:  02/23/16 1229    Specimen Information:  Blood Updated:  02/23/16 1340    Narrative:      Fasting specimen    Hemoglobin A1C [[id number]] Collected:  02/23/16 1229    Specimen Information:  Blood Updated:  02/23/16 1340    Troponin I [[id number]] Collected:  02/23/16 1230    Specimen Information:  Blood Updated:  02/23/16 1301     Troponin I 0.01 ng/mL     Ethanol (Alcohol) Level [[id number]] Collected:  02/23/16 1229  Specimen Information:  Blood Updated:  02/23/16 1258     Alcohol None Detected mg/dL     Protime-INR [[id number]]  (Abnormal) Collected:  02/23/16 1229    Specimen Information:  Blood Updated:  02/23/16 1248     PT 12.5 (L) sec      PT  INR 0.9      PT Anticoag. Given Within 48 hrs. None     APTT [[id number]] Collected:  02/23/16 1229     PTT 29 sec Updated:  02/23/16 1248    CBC with differential [[id number]]  (Abnormal) Collected:  02/23/16 1229    Specimen Information:  Blood from Blood Updated:  02/23/16 1241     WBC 6.08 x10 3/uL      Hgb 14.2 g/dL      Hematocrit 08.6 %      Platelets 165 x10 3/uL      RBC 4.53 (L) x10 6/uL      MCV 93.4 fL      MCH 31.3 pg      MCHC 33.6 g/dL      RDW 15 %      MPV 9.2 (L) fL      Neutrophils 64.1 %      Lymphocytes Automated 28.5 %      Monocytes 6.6 %      Eosinophils Automated 0.3 %      Basophils Automated 0.5 %      Immature Granulocyte 0.2 %      Neutrophils Absolute 3.90 x10 3/uL      Abs Lymph Automated 1.73 x10 3/uL      Abs Mono Automated 0.40 x10 3/uL      Abs Eos Automated 0.02 x10 3/uL      Absolute Baso Automated 0.03 x10 3/uL      Absolute Immature Granulocyte 0.01 x10 3/uL     Glucose Whole Blood - POCT [[id number]]  (Abnormal) Collected:  02/23/16 1225     POCT - Glucose Whole blood 122 (H) mg/dL Updated:  52/84/13 2440    Comprehensive metabolic panel [[id number]] Collected:  02/23/16 1229    Specimen Information:  Blood Updated:  02/23/16 1230    Magnesium [[id number]] Collected:  02/23/16 1229    Specimen Information:  Blood Updated:  02/23/16 1230    Phosphorus [[id number]] Collected:  02/23/16 1229    Specimen Information:  Blood Updated:  02/23/16 1230        Radiology reports have been reviewed:  Radiology Results (24 Hour)     Procedure Component Value Units Date/Time    Chest AP Portable [[id number]] Collected:  02/23/16 1231    Order Status:  Completed Updated:  02/23/16 1238    Narrative:      Chest x-ray    HISTORY: Stroke evaluation    Comparison: 12/14/2007    Frontal view of the chest obtained. Increased lung volumes. No focal  consolidation within the lungs. Pulmonary vasculature within normal  limits. No definite pleural effusion or pneumothorax. Cardia mediastinal  contour  unremarkable.      Impression:       No acute finding.    Sallye Lat, MD   02/23/2016 12:34 PM      CT Head without Contrast [[id number]] Collected:  02/23/16 1214    Order Status:  Completed Updated:  02/23/16 1220    Narrative:      HISTORY: 35 years old,STROKE, left-sided numbness         TECHNIQUE: Routine  head CT without contrast.  Dose reduction with automatic exposure control, iterative  reconstruction, and/or adjustment of the mA and/or kV according to  patient size.  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   There is no evidence of acute ischemia with unremarkable gray-white  junction.  There is no mass, mass effect, hemorrhage, or extra-axial fluid  collection.   The ventricles and the basal cisterns are unremarkable.   The paranasal sinuses are clear.             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                   Einar Pheasant, MD   02/23/2016 12:16 PM          EKG: EKG Interpretation    Interpreted by me    Rhythm: normal sinus   Rate: normal  Axis: normal  Ectopy: none  Conduction: normal  ST Segments: no acute change  T Waves: no acute change  Q Waves: none    Clinical Impression:  normal EKG  Hospitalist   Signed by:   Danella Maiers Renkes  02/23/2016 3:25 PM    *This note was generated by the Epic EMR system/ Dragon speech recognition and may contain inherent errors or omissions not intended by the user. Grammatical errors, random word insertions, deletions, pronoun errors and incomplete sentences are occasional consequences of this technology due to software limitations. Not all errors are caught or corrected. If there are questions or concerns about the content of this note or information contained within the body of this dictation they should be addressed directly with the author for clarification

## 2016-02-23 NOTE — Plan of Care (Signed)
Pain Management Plan    Education about your Pain Management.    Dear [name],    It is my pleasure to care for you during your hospitalization here at [facility]. We have initiated a new program to educate our patients and/or your family members about your pain management plan.    At [facility], we are committed to providing patients the best patient care possible.  We are seeking every opportunity to meet your unique needs.     Pain is the most common medical problem that requires urgent attention.We understand how difficult it is to have pain and will work with you and your physicians to develop an effective pain management solution.    You are experiencing pain due to your migraine or srtoke and our goal is to control your pain to a tolerable level where you are able to perform activities of daily living.    This is what you have identified that helps you manage pain at home:  Not applicable.    We will administer medications to control your pain as prescribed by your provider.  We will offer to provide other comfort measures as needed. We will follow up frequently to assess your pain and the effectiveness of your  pain management plan. We will communicate with your Physicians frequently to change the medications if needed.    Thank you for your time.    Rosemary Holms, RN  02/23/2016  4:09 PM  [facility]  [address]  [place], [place]  [address]

## 2016-02-23 NOTE — Progress Notes (Signed)
Admitted from ER L via stretcher accompanied by transporter. Oriented to the unit and plan of care.

## 2016-02-23 NOTE — Plan of Care (Signed)
Problem: Safety  Goal: Patient will be free from injury during hospitalization  Outcome: Progressing  Able to use the call light for assistance when needed    Problem: Pain  Goal: Pain at adequate level as identified by patient  Outcome: Progressing  C/o headache, Tylenol given. Pt is aware of PRN pain medication    Problem: Day of Admission - Stroke  Goal: Core/Quality measure requirements - Admission  Outcome: Progressing  Verbalized understanding about smoking cessation importance, and blood pressure control, to avoid TIA/ stroke. Care notes given and explained to pt.

## 2016-02-23 NOTE — Progress Notes (Signed)
02/23/16 2000   Patient Type   Within 30 Days of Previous Admission? No   Healthcare Decisions   Interviewed: Patient   [place] Information: [phone]   Orientation/Decision Making Abilities of Patient Alert and Oriented x3, able to make decisions   Advance Directive Patient does not have advance directive   Additional Emergency Contacts? [phone]   Prior to admission   Prior level of function Independent with ADLs   Type of Residence Private residence   Have running water, electricity, heat, etc? Yes   Living Arrangements Alone   How do you get to your MD appointments? patient reports he has transportation - he does not drive   Who picks up your prescriptions? patient takes no medications at this time   Dressing Independent   Grooming Independent   Feeding Independent   Bathing Independent   Toileting Independent   Discharge Planning   Support Systems Family members;Friends/neighbors  (patient works for [employer] and works with hbrother . )   Patient expects to be discharged to: home   Anticipated [place] plan discussed with: Same as interviewed   [place] discussion contact information: [phone]   Follow up appointment scheduled? No   Reason no follow up scheduled? Referred to [facility]   Mode of transportation: Private car (family member)   Consults/Providers   PT Evaluation Needed 1   OT Evalulation Needed 1   SLP Evaluation Needed 1   Important Message from Medicare Notice   Patient received 1st IMM Letter? n/a

## 2016-02-24 ENCOUNTER — Encounter (INDEPENDENT_AMBULATORY_CARE_PROVIDER_SITE_OTHER): Payer: Self-pay

## 2016-02-24 DIAGNOSIS — G43109 Migraine with aura, not intractable, without status migrainosus: Secondary | ICD-10-CM

## 2016-02-24 LAB — ECG 12-LEAD
Atrial Rate: 63 {beats}/min
P Axis: 42 degrees
P-R Interval: 140 ms
Q-T Interval: 396 ms
QRS Duration: 86 ms
QTC Calculation (Bezet): 405 ms
R Axis: 50 degrees
T Axis: 51 degrees
Ventricular Rate: 63 {beats}/min

## 2016-02-24 MED ORDER — HYDROMORPHONE HCL 2 MG PO TABS
2.0000 mg | ORAL_TABLET | ORAL | Status: DC | PRN
Start: 2016-02-24 — End: 2016-02-25
  Administered 2016-02-24: 2 mg via ORAL
  Filled 2016-02-24: qty 1

## 2016-02-24 MED ORDER — SUMATRIPTAN SUCCINATE 6 MG/0.5ML SC SOLN
6.0000 mg | Freq: Once | SUBCUTANEOUS | Status: AC
Start: 2016-02-24 — End: 2016-02-24
  Administered 2016-02-24: 6 mg via SUBCUTANEOUS
  Filled 2016-02-24: qty 0.5

## 2016-02-24 MED ORDER — KETOROLAC TROMETHAMINE 30 MG/ML IJ SOLN
30.0000 mg | Freq: Four times a day (QID) | INTRAMUSCULAR | Status: DC | PRN
Start: 2016-02-24 — End: 2016-02-25
  Administered 2016-02-24 – 2016-02-25 (×3): 30 mg via INTRAVENOUS
  Filled 2016-02-24 (×3): qty 1

## 2016-02-24 MED ORDER — BUTALBITAL-APAP-CAFFEINE 50-325-40 MG PO TABS
2.0000 | ORAL_TABLET | Freq: Four times a day (QID) | ORAL | Status: DC | PRN
Start: 2016-02-24 — End: 2016-02-25
  Administered 2016-02-24 (×2): 2 via ORAL
  Filled 2016-02-24 (×2): qty 2

## 2016-02-24 MED ORDER — DIPHENHYDRAMINE HCL 25 MG PO CAPS
50.0000 mg | ORAL_CAPSULE | Freq: Every evening | ORAL | Status: DC | PRN
Start: 2016-02-24 — End: 2016-02-25
  Administered 2016-02-24: 50 mg via ORAL
  Filled 2016-02-24: qty 2

## 2016-02-24 MED ORDER — HYDROMORPHONE HCL 1 MG/ML IJ SOLN
0.5000 mg | INTRAMUSCULAR | Status: DC | PRN
Start: 2016-02-24 — End: 2016-02-25

## 2016-02-24 MED ORDER — ENOXAPARIN SODIUM 40 MG/0.4ML SC SOLN
40.0000 mg | Freq: Every day | SUBCUTANEOUS | Status: DC
Start: 2016-02-24 — End: 2016-02-25
  Administered 2016-02-24 – 2016-02-25 (×2): 40 mg via SUBCUTANEOUS
  Filled 2016-02-24 (×2): qty 0.4

## 2016-02-24 NOTE — Consults (Signed)
CONSULTATION REPORT    DICTATOR NAME:  Gordan Payment MD  DATE OF SERVICE:  02/24/2016    HISTORY OF PRESENT ILLNESS :  The patient is a 35 year old gentleman who presented with a 3-4 day history of headache with left-sided numbness.  The patient has a diagnosis of migraine previously.  There is no change in the vision, speech or swallowing.  Some improvement in the headache took place with the symptomatic treatment here.  There is no confusion or disorientation.    PAST MEDICAL HISTORY  :  Consists of transient ischemic attack, history of migraine, tobacco abuse, history of cocaine abuse.     Reportedly, he had a TIA when he was on cocaine.    PAST SURGICAL HISTORY :  Not pertinent.    FAMILY HISTORY :  Not pertinent.    SOCIAL HISTORY :  Legally separated.  He is a smoker.  History of cocaine in the past.    ALLERGIES:  No known allergies.    MEDICATIONS :  Here in the hospital he was on:     1. Aspirin 325 mg a day.    2. Atorvastatin 10 mg a day.   3. Nicotine patch transdermal.   4. He was given one dose of sumatriptan also.      He is being treated with Fioricet.    REVIEW OF SYSTEMS :  Negative for change in mental status.  Negative for any hearing deficit.  Negative for any speech difficulties.  Negative for any visual change.  Negative for any significant weakness.  Positive for left-sided numbness.  Negative for involuntary movements.  Negative for any chest pain, shortness of breath.  Negative for abdominal pain, nausea, vomiting or diarrhea.  Positive for headache.  Negative for fever.    VITAL SIGNS:  Blood pressure 124/81, pulse 70, respirations 16, temperature 97.6.    PHYSICAL EXAMINATION:  He was alert and oriented.  Speech normal in context and clarity.  He was complaining of a headache.  Memory was intact.  Cranial nerves 2-12 intact.  Motor exam full proximally and distally.  No involuntary movements.  Sensation grossly intact.  Some subjective tingling reported on the left upper and lower  extremity.  Finger to nose test was normal.  Reflexes full and symmetrical.  Plantars equivocal.     LABORATORY DATA:   I have reviewed the lab reports.     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  MRA of the head, MRI of the brain without contrast did not show any acute infarct, intracranial hemorrhage or mass effect.  A small chronic cerebellar infarct.  Minimal tiny nonspecific T2 FLAIR hyperintensities were seen.  No stenosis in the intracranial vasculature.  There is tiny outpouching involving the internal carotid artery terminus which may reflect an infundibulum but an aneurysm is not excluded.  Follow up intracranial MRA in 6-12 months was recommended.  MRI of the brain reported as above.  Carotid ultrasound was normal.       EKG was showing sinus rhythm.       Echocardiogram was pending.    ASSESSMENT/SUGGESTION:  A 35 year old gentleman, came with a headache and left-sided tingling and numbness sensation.  No significant weakness.  He has been having diagnosis of migraine in the past.  He has a history of cocaine abuse.  He has history of transient ischemic attack in the past.  His MR studies of the brain here during this hospital stay did not  show any acute intracranial abnormality, no infarct was seen.  Carotid ultrasound is within normal limits.  Cardiac echocardiogram is pending.  EKG was showing sinus rhythm.  He is on aspirin.  Since MRI of the report showed old right cerebellar infarct, I would like to keep him on aspirin 81 mg a day.  Reportedly, he had a TIA in the past when he was on cocaine, but considering that he did have a stroke and maybe at risk of drug abuse to be on the safe side it is better to keep him on low dose aspirin.  He can be treated with Fiorinal or Dilaudid during this hospital stay as he did not benefit from Imitrex.  Most likely his symptoms are related to a complex migraine.  I can see him in my office for followup.        Downtime procedure was  in place during the time of transcription    /DD  D:  02/24/2016 14:51:11  T:  02/24/2016 15:26:37  JOB:  [id number]

## 2016-02-24 NOTE — Progress Notes (Signed)
[place] [facility] ([facility]    Received a referral to schedule a follow up appointment with the [facility].  Appointment scheduled for 7/24 at 10:30 am with NP Otelia Limes at [facility]  location.      Clinic address is as follows:    [address] [place], [address]    Please notify patient to arrive 15 minutes early to the appointment and bring the following materials with them:    Insurance card (if insured) and photo ID  Medications in their original bottles  Glucometer/blood sugar log (if diabetic)  Weight log (if heart failure)  Proof of income (to enroll in medication assistance programs-first two pages of signed 1040 tax forms or last 2 months of pay stubs)      Leandra Kern Reg Rep II  [employer]  T [phone] F [phone]

## 2016-02-24 NOTE — Progress Notes (Signed)
Patient called this RN into room to assess bug bite on right forearm. Patient states he was bitten by a bug PTA and noticed the site getting bigger. Upon inspection site is raised but scabbed over there is no redness or drainage, will notify MD.

## 2016-02-24 NOTE — Plan of Care (Signed)
Problem: Safety  Goal: Patient will be free from injury during hospitalization  Outcome: Progressing    02/24/16 2350   Goal/Interventions addressed this shift   Patient will be free from injury during hospitalization  Assess patient's risk for falls and implement fall prevention plan of care per policy;Provide and maintain safe environment;Use appropriate transfer methods;Ensure appropriate safety devices are available at the bedside;Include patient/ family/ care giver in decisions related to safety;Hourly rounding         Problem: Pain  Goal: Pain at adequate level as identified by patient  Outcome: Progressing    02/24/16 2350   Goal/Interventions addressed this shift   Pain at adequate level as identified by patient Identify patient comfort function goal;Assess for risk of opioid induced respiratory depression, including snoring/sleep apnea. Alert healthcare team of risk factors identified.;Assess pain on admission, during daily assessment and/or before any "as needed" intervention(s);Reassess pain within 30-60 minutes of any procedure/intervention, per Pain Assessment, Intervention, Reassessment (AIR) Cycle;Evaluate if patient comfort function goal is met;Evaluate patient's satisfaction with pain management progress;Include patient/patient care companion in decisions related to pain management as needed

## 2016-02-24 NOTE — Progress Notes (Signed)
Sister on the phone,c/o pt is having some stuttering,but explained that when clarified with the pt upon change of shift,pt says he's been having that before,so its a chronic episode,will continue to monitor

## 2016-02-24 NOTE — OT Eval Note (Signed)
[facility]  [address]  [place], [place]. [address]    Department of Rehabilitation Services  [phone]    Occupational Therapy Evaluation    Patient: Todd [name]    MRN#: [id number]     W112/W112.A    Time of treatment: Time Calculation  OT Received On: 02/24/16  Start Time: 1415  Stop Time: 1445  Time Calculation (min): 30 min  OT Visit Number: 1    Consult received for Todd [name] for OT Evaluation and Treatment.  Patient's medical condition is appropriate for Occupational therapy intervention at this time.    Assessment:   .Todd [name] is a 35 y.o. male admitted 02/23/2016.   Expanded chart review completed including review of labs, review of imaging, review of vitals and review of H&P and physician progress notes.  Pt's ability to complete ADLs and functional transfers is impaired due to the following deficits:  decreased balance, dizziness/vertigo, gait impairment, sensation deficits, decreased strength and asymmetry.  Pt demonstrates performance deficits with grooming, dressing and toileting. There are a few comorbidities or other factors that affect [name] of care and require modification of task including: assistive device needed for mobility, neuropathy, residual neurological symptoms, vertigo/dizziness and home alone for a portion of the day.  Pt would continue to benefit from OT to address these deficits and increase functional independence.    Assessment: decreased ROM;decreased strength;decreased independence with ADLs;decreased independence with IADLs;decreased endurance/activity tolerance     Complexity Chart Review Performance Deficits Clinical Decision Making Hx/Comorbidities Assistance needed   Low Brief 1-3 Limited options None None (or at baseline)   Moderate Expanded 3-5 Several Options 1-2 Min/Mod assist (not at baseline)   High Extensive 5 or more Multiple options 3 or more Max/dependent (not at baseline     Therapy Diagnosis: decreased functional mobility ,  decreased independence with ADL's, increased gait dysfunction and unilateral weakness/paralysis due to HPI. Without therapy interventions, patient is at risk for falls, dependence on caregivers for mobility, dependence on caregivers for ADL's and failure to return to PLOF.    Rehabilitation Potential: Prognosis: Good;With continued OT s/p acute discharge      [name]:   OT Frequency Recommended: 3-4x/wk   Treatment Interventions: ADL retraining;Functional transfer training;UE strengthening/ROM;Endurance training;Patient/Family training;Compensatory technique education          Risks/Benefits/POC Discussed with Pt/Family: With patient    Goals:   Goal Formulation: Patient  Time For Goal Achievement: 5 visits  ADL Goals  Patient will dress lower body: Modified Independent;with AE;by time of discharge  Patient will toilet: Modified Independent;with AE;by time of discharge  Mobility and Transfer Goals  Pt will transfer bed to toilet: Modified Independent;with rolling walker;by time of discharge                         Discharge Recommendations:   Based on today's session patient's discharge recommendation is the following: Discharge Recommendation: Acute Rehab.     If Discharge Recommendation: Acute Rehab is not available, then the patient will need home health services, increase supervision , assistance with mobility, assistance with ADL's and assistance with IADL's.  DME Recommended for Discharge: Front wheel walker        Precautions and Contraindications: Fall          Medical Diagnosis: Left arm numbness [R20.0]  Left leg numbness [R20.0]  Left eye complaint [H57.9]    History of Present Illness: Todd [name] is a 35 y.o. male  admitted on 02/23/2016  "with past medical history see above comes into the ER complaining of new stroke signs and symptoms.  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 Does not [name] any antiplatelets  or anticoagulation then. He says 4 days ago he was having a left-sided headache with left eye twitching and photosensitivity so he went to [facility] for where he ruled out for acute CVA, but was diagnosed with a transient ischemic attack but apparently not put on any aspirin then.  Last night while was watching a movie with his mother, he started having again the left-sided headache with left eye twitching like he could not close his eye, but he thought it was related to stress, so he went to bed. When he woke up the morning, he did not have the symptoms, but he just felt "off".  He went to work and when he got to work, he again started having the left-sided headache with left eye twitching and when he went to lunch at [employer] around noon time.  His coworker noted that he had a left sided facial droop and patient started complaining of left-sided arm and leg weakness with numbness and tingling on his left face, arm and leg. He has no more droop or eye twitching issues, but still feels numb on his left side with mild focal weakness noted.  He denies any other recent illnesses or recent travel. Family history of CVA and denies personal history of hypertension, hyperlipidemia or diabetes.  Patient is not a TPA candidate due to being out of the TPA window. "- Per MD H &P.       Patient Active Problem List   Diagnosis   . Left-sided weakness   . Left leg numbness   . Left eye complaint   . Left arm numbness   . Hx-TIA (transient ischemic attack)   . History of migraine   . Tobacco abuse   . Complicated migraine        Past Medical/Surgical History:  Past Medical History   Diagnosis Date   . Hx-TIA (transient ischemic attack)    . History of migraine    . Tobacco abuse       History reviewed. No pertinent past surgical history.      X-Rays/Tests/Labs:    MRI Brain WO Contrast [[id number]] Collected: 02/24/16 0742     Order Status: Completed Updated: 02/24/16 0924     Narrative:     HISTORY: Left-sided weakness  and numbness    TECHNIQUE: Multiplanar, multisequence MR imaging of the brain was  performed without intravenous contrast. MRA imaging of the intracranial  vasculature was also obtained.    COMPARISON: Head CT 02/23/2016    FINDINGS: There is a small region of encephalomalacia in the inferior  aspect of the right cerebellar hemisphere which is most consistent with  a chronic infarct. There are a few tiny nonspecific T2/FLAIR  hyperintensities in the subcortical white matter of the frontal lobes.  These are nonspecific findings, potentially reflecting gliosis from any  prior insult, injury, or inflammatory process. Note that subcortical  lesions have been described in patients with migraine headaches. There  is no mass effect or midline shift. There is no evidence of an acute  intracranial hemorrhage or infarct. There are no extra-axial fluid  collections.     The orbits are grossly unremarkable. The mastoid air cells appear well  aerated.    MRA imaging of the intracranial vasculature demonstrates patency of the  internal carotid arteries, which bifurcate into the anterior and middle  cerebral arteries bilaterally. There is a tiny outpouching involving the  right ICA terminus, directed posteriorly and medially, which is best  seen on series 3 image, 97. This finding may reflect an infundibulum,  but is too small to fully characterize, and an aneurysm is not entirely  excluded. The basilar artery is normal in caliber and gives rise to the  posterior cerebral arteries. There is no focal stenosis.    Impression:        1. No acute infarct, intracranial hemorrhage, or mass effect.  2. Small chronic right cerebellar infarct.  3. Minimal tiny nonspecific T2/FLAIR hyperintensities in the  supratentorial white matter, as detailed above.  4. No stenosis in the intracranial vasculature. There is a tiny  outpouching involving the ICA terminus which may reflect an  infundibulum, but an aneurysm is not excluded. A follow-up  intracranial  MRA in 6-12 months is recommended to assess stability.    Nicoletta Dress, MD   02/24/2016 9:20 AM      Social History:  Prior Level of Function  Prior level of function: Independent with ADLs, Ambulates independently  Baseline Activity Level: Community ambulation  Driving: does not drive  Cooking: Yes  Employment: [employer] Conservation officer, historic buildings)  Home Living Arrangements  Living Arrangements: Alone, Family members (Pt reports Mother and sister are in apartment as well)  Type of Home: Apartment  Home Layout: One level, Performs ADL's on one level, Stairs to enter with rails (add number in comment)  Bathroom Shower/Tub: Walk-in shower      Subjective:   Patient is agreeable to participation in the therapy session. Nursing clears patient for therapy.      .        Objective:   Observation of Patient/Vital Signs:  Patient is in bed with telemetry in place.         Cognition/Neuro Status  Arousal/Alertness: Appropriate responses to stimuli  Attention Span: Appears intact  Orientation Level: Oriented X4  Memory: Appears intact  Following Commands: independent  Safety Awareness: independent  Problem Solving: Able to problem solve independently  Behavior: calm;cooperative  Motor Planning: intact  Coordination: FMC impaired (left UE)  Hand Dominance: right handed    Gross ROM  Right Upper Extremity ROM: within functional limits  Left Upper Extremity ROM: within functional limits (AROM grossly WFL; )  Gross Strength  Right Upper Extremity Strength: 5/5  Left Upper Extremity Strength: 3/5          Sensory  Auditory: intact  Tactile - Light Touch: impaired left (tingling)  Visual Acuity: intact       Self-care and Home Management  Eating: Independent  LB Dressing: Minimal Assist;edge of bed;Don/doff R sock;Don/doff L sock  Toileting: Minimal Assist;supervison/safety;steadying  Functional Transfers: Minimal Assist;steadying;increased time to complete;supervision/safety    Mobility and Transfers  Supine to Sit: Contact Guard  Assist;using bedrail;Increased Time;Increased Effort  Sit to Stand: Minimal Assist;Increased Time;Increased Effort (Pt with dizziness during tasks)  Bed to BSC: Minimal Assist (steadying; needed RW. )  Functional Mobility/Ambulation: Minimal Assist (RW)     Balance  Static Sitting Balance: Stand by Assist  Dyanamic Sitting Balance: Minimal Assist  Static Standing Balance: Contact Guard Assist    Participation and Endurance  Participation Effort: excellent  Endurance: Endurance does not limit participation in activity      Treatment Activities: Patient engaged  in the therapeutic activities stated above and presents with deficits in functional mobility, transfers, and ADL performance due to new onset left side numbness/weakness.Patient was educated regarding home safety considerations and equipment needs based on current deficits Patient was instructed on energy conservation strategies focusing on pacing of activities throughout the day, prioritizing activities, and proper body mechanics during functional activities while feeling weak and impaired. During functional mobility and ADL tasks, Pt reports dizziness that does not subside, and was returned to bed.     Educated the patient to role of occupational therapy, [name] of care, goals of therapy and safety with mobility and ADLs.      Tiffaney Heimann R Tammie Yanda, MS, OTR/L  Occupational Therapist  [facility]  Physical Medicine and Rehabilitation

## 2016-02-24 NOTE — Plan of Care (Signed)
Problem: Safety  Goal: Patient will be free from injury during hospitalization  Outcome: Progressing    02/23/16 2008   Goal/Interventions addressed this shift   Patient will be free from injury during hospitalization  Assess patient's risk for falls and implement fall prevention plan of care per policy;Provide and maintain safe environment;Use appropriate transfer methods;Ensure appropriate safety devices are available at the bedside;Include patient/ family/ care giver in decisions related to safety;Assess for patients risk for elopement and implement Elopement Risk Plan per policy;Hourly rounding   Pt alert and oriented x4 with good judgement and safety awareness. Bed in lowest position and room is clean and clutter free. Call bell in reach and hourly rounding ongoing. Will continue to monitor        Problem: Pain  Goal: Pain at adequate level as identified by patient  Outcome: Progressing    02/23/16 2008   Goal/Interventions addressed this shift   Pain at adequate level as identified by patient Identify patient comfort function goal;Assess pain on admission, during daily assessment and/or before any "as needed" intervention(s);Reassess pain within 30-60 minutes of any procedure/intervention, per Pain Assessment, Intervention, Reassessment (AIR) Cycle;Assess for risk of opioid induced respiratory depression, including snoring/sleep apnea. Alert healthcare team of risk factors identified.;Evaluate if patient comfort function goal is met;Evaluate patient's satisfaction with pain management progress;Offer non-pharmacological pain management interventions;Consult/collaborate with Physical Therapy, Occupational Therapy, and/or Speech Therapy   Patient given imitrex sub q for headache. Currently sleeping in bed with no s/s of distress. Will continue to monitor.

## 2016-02-24 NOTE — Progress Notes (Signed)
[facility]  Progress Note  Patient Info:   Date/Time: 02/24/2016 / 2:51 PM   Admit Date:02/23/2016  Patient Name:Todd [name]   EXB:[id number]   PCP: Christa See, MD  Attending Physician:Graysyn Bache, Harrell Gave, MD     Subjective:   02/24/2016 cotnin to have HA and left sided numbness/ weakness  Chief Complaint:  Numbness  Update of Review of Systems:  Review of Systems   Constitutional: Negative for fever, chills, weight loss and malaise/fatigue.   HENT: Negative for ear pain, hearing loss and tinnitus.    Eyes: Negative for blurred vision, double vision, photophobia, pain and discharge.   Respiratory: Negative for cough, hemoptysis, sputum production and shortness of breath.    Cardiovascular: Negative for chest pain, palpitations, orthopnea and claudication.   Gastrointestinal: Negative for heartburn, nausea, vomiting, abdominal pain, diarrhea, constipation and blood in stool.   Genitourinary: Negative for dysuria, urgency, frequency and hematuria.   Musculoskeletal: Negative for myalgias, back pain, joint pain and neck pain.   Skin: Negative for itching and rash.   Neurological: Positive for sensory change, focal weakness and headaches. Negative for dizziness, tingling, tremors and speech change.   Psychiatric/Behavioral: Negative for depression, suicidal ideas, hallucinations and substance abuse.     Assessment and Plan:      Neuro: Strokelike symptom with left-sided weakness, numbness and tingling with left sided facial droop, left eye twitching and left-sided headache: History of transient ischemic attack in 2014.  MRI did not show any acute stroke, but did show an old small cerebellum infarct.  MRA shows outpouching involving the internal carotid artery, which merely flicked and infundibulum, but an aneurysm is not excluded.  Recommend a follow-up MRA in 6-12 months.  Neurology consult called with Dr. Marney Doctor; who agrees that symptoms are likely related to complex migraine.  Dr. Marney Doctor recommends  Dilaudid for pain as well as Toradol and to send home on Fiorinal   'Tobacco abuse: Encouraged cessation   Diagnosis complex migraine 4 days ago to [facility]. Agree with Fioricet as needed for headache   Disposition physical therapy is recommended acute rehabilitation for his left-sided weakness and ambulatory dysfunction.  We will keep overnight due to persistent headache and symptoms.  Possible discharge tomorrow    DVT Prohylaxis:lovenox   Central Line/Foley Catheter/PICC line status: none  Code Status: Full Code  Disposition:home or acute rehabilitation  Type of Admission:Observation  Anticipated Length of Stay: One to 2 days  Medical Necessity for stay: Complex migraine  Hospital Problems:   Principal Problem:    Left-sided weakness  Active Problems:    Left leg numbness    Left eye complaint    Left arm numbness    Hx-TIA (transient ischemic attack)    History of migraine    Tobacco abuse    Objective:     Filed Vitals:    02/24/16 0155 02/24/16 0600 02/24/16 1000 02/24/16 1407   BP: 120/71 121/74 132/80 124/81   Pulse: 65 66 63 70   Temp: 97.4 F (36.3 C) 97.6 F (36.4 C) 97.5 F (36.4 C) 97.6 F (36.4 C)   TempSrc: Temporal Artery Temporal Artery Temporal Artery Temporal Artery   Resp: 18 18 18 16    Height:       Weight:       SpO2: 96%  100% 99%     Physical Exam:   Physical Exam   Constitutional: He is oriented to person, place, and time. He appears distressed.   HENT:   Head:  Normocephalic and atraumatic.   Neck: Normal range of motion. Neck supple.   Cardiovascular: Exam reveals no gallop and no friction rub.    No murmur heard.  Pulmonary/Chest: No respiratory distress. He has no wheezes. He has no rales. He exhibits no tenderness.   Abdominal: He exhibits no distension and no mass. There is no tenderness. There is no rebound and no guarding.   Musculoskeletal: He exhibits no edema or tenderness.   Neurological: He is alert and oriented to person, place, and time.   Mild left-sided  upper extremity weakness   Skin: No rash noted. No erythema. No pallor.     Results of Labs/imaging   Labs and radiology reports have been reviewed.    Hospitalist   Signed by:   Bertrum Sol  02/24/2016 2:51 PM    *This note was generated by the Epic EMR system/ Dragon speech recognition and may contain inherent errors or omissions not intended by the user. Grammatical errors, random word insertions, deletions, pronoun errors and incomplete sentences are occasional consequences of this technology due to software limitations. Not all errors are caught or corrected. If there are questions or concerns about the content of this note or information contained within the body of this dictation they should be addressed directly with the author for clarification

## 2016-02-24 NOTE — PT Eval Note (Signed)
[facility]  [address]  [place], [place]. [address]    Department of Rehabilitation  [phone]    Physical Therapy Evaluation    Patient: Todd [name]    MRN#: [id number]     W112/W112.A    Time of treatment: Time Calculation  PT Received On: 02/24/16  Start Time: 0829  Stop Time: 0848  Time Calculation (min): 19 min    PT Visit Number: 1    Consult received for Todd [name] for PT Evaluation and Treatment.  Patient's medical condition is appropriate for Physical therapy intervention at this time.      Assessment:   Todd [name] is a 35 y.o. male admitted 02/23/2016.  Pt's functional mobility is impacted by:  decreased activity tolerance, decreased balance and decreased strength.  There are no comorbidities or other factors that affect [name] of care.  Standardized tests and exams incorporated into evaluation include AMPAC mobility.  Pt demonstrates a evolving clinical presentation due to dizziness.   Pt would continue to benefit from PT to address these deficits and increase functional independence.     Complexity Level Hx and Co  morbidites Examination Clinical Decision Making Clinical Presentation   Low no impact 1-2 elements Limited options Stable   Moderate   1-2 factors 3 or more   Several options Evolving, [name] may alter   High 3 or more 4 or more Multiple options Unstable, unpredictable       Impairments: Assessment: Decreased functional mobility;Decreased LE strength;Decreased sensation.     Therapy Diagnosis: generalized weakness, decreased functional mobility  and increased gait dysfunction due to L sided weakness. Without therapy interventions, patient is at risk for falls and failure to return to PLOF.    Rehabilitation Potential: Prognosis: Good;With continued PT status post acute discharge      [name]:    Treatment/Interventions: Gait training;Stair training;Neuromuscular re-education;Functional transfer training;LE strengthening/ROM;Endurance training PT Frequency: 4-5x/wk     Risks/Benefits/POC Discussed with Pt/Family: With patient          Goals:   Goals  Goal Formulation: With patient  Time for Goal Acheivement: By time of discharge  Goals: Select goal  Pt Will Perform Sit to Stand: independent;to maximize functional mobility and independence;5 visits  Pt Will Stand: 3-5 min;independent;to maximize functional mobility and independence;5 visits  Pt Will Ambulate: 101-150 feet;independent;to maximize functional mobility and independence;5 visits  Pt Will Go Up / Down Stairs: 6-10 stairs;with supervision;to maximize functional mobility and independence;5 visits      Discharge Recommendations:   Based on today's session patient's discharge recommendation is the following: Acute Rehab     If Discharge Recommendation: Acute Rehab is not available, then the patient will need increase supervision  and OP PT.         Precautions and Contraindications: fall risk        Medical Diagnosis: Left arm numbness [R20.0]  Left leg numbness [R20.0]  Left eye complaint [H57.9]    History of Present Illness: Todd [name] is a 35 y.o. male admitted on 02/23/2016 with "new stroke signs and symptoms.  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 Does not [name] any antiplatelets or anticoagulation then. He says 4 days ago he was having a left-sided headache with left eye twitching and photosensitivity so he went to [facility] for where he ruled out for acute CVA, but was diagnosed with a  transient ischemic attack but apparently not put on any aspirin then.  Last night while was watching a movie with his mother, he started having again the left-sided headache with left eye twitching like he could not close his eye, but he thought it was related to stress, so he went to bed. When he woke up the morning, he did not have the symptoms, but he just felt "off".  He went to work and when he got to work, he again  started having the left-sided headache with left eye twitching and when he went to lunch at [employer] around noon time.  His coworker noted that he had a left sided facial droop and patient started complaining of left-sided arm and leg weakness with numbness and tingling on his left face, arm and leg. He has no more droop or eye twitching issues, but still feels numb on his left side with mild focal weakness noted.  He denies any other recent illnesses or recent travel. Family history of CVA and denies personal history of hypertension, hyperlipidemia or diabetes.  Patient is not a TPA candidate due to being out of the TPA window" per H&P.    Patient Active Problem List   Diagnosis   . Left-sided weakness   . Left leg numbness   . Left eye complaint   . Left arm numbness   . Hx-TIA (transient ischemic attack)   . History of migraine   . Tobacco abuse        Past Medical/Surgical History:  Past Medical History   Diagnosis Date   . Hx-TIA (transient ischemic attack)    . History of migraine    . Tobacco abuse       History reviewed. No pertinent past surgical history.      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      Social History:  Prior Level of Function  Prior level of function: Independent with ADLs, Ambulates independently  Baseline Activity Level: Community ambulation  Driving: does not drive  Cooking: Yes  Employment: FT Conservation officer, historic buildings)  Home Living Arrangements  Living Arrangements: Alone  Type of Home: Apartment  Home Layout: One level, Performs ADL's on one level, Stairs to enter with rails (add number in comment)  Bathroom Shower/Tub: Walk-in shower      Subjective:    Patient is agreeable to participation in the therapy session. Nursing clears patient for therapy. Reports continued headache despite pain medication.   Patient Goal  Patient Goal: To feel better       Objective:   Observation of Patient/Vital Signs:  Patient is in bed with telemetry in  place.    Cognition/Neuro Status  Arousal/Alertness: Appropriate responses to stimuli  Attention Span: Appears intact  Orientation Level: Oriented X4  Memory: Appears intact  Following Commands: Follows one step commands with increased time  Safety Awareness: minimal verbal instruction  Insights: Fully aware of deficits  Behavior: attentive;calm;cooperative  Motor Planning: intact  Coordination: intact (Slow B but appeared equal, FTN, serial opposition, RAM UE/LE)  Hand Dominance: right handed    Vision: L lateral peripheral vision loss, blurry vision L, R eye intact  Sensation: impaired light touch L UE/LE, intact R UE/LE    Gross ROM  Right Lower Extremity ROM: within functional limits  Left Lower Extremity ROM: within functional limits  Gross Strength  Right Lower Extremity Strength: 5/5  Left Lower Extremity Strength:  (Inc time for motor  recruitment, unable to sustain contractio)  L Hip Flexion: 4-/5  L Knee Flexion: 4-/5  L Knee Extension: 4/5  L Ankle Dorsiflexion: 4-/5  L Ankle Plantar Flexion: 4-/5       Functional Mobility  Supine to Sit: Supervision;HOB raised  Sit to Supine: Supervision  Sit to Stand: Minimal Assist  Stand to Sit: Minimal Assist     Locomotion  Ambulation: Minimal Assist (with handheld assistance)  Ambulation Distance (Feet): 50 Feet  Pattern: L decreased stance time;decreased cadence;decreased step length (Instruction for quad set during L stance to prevent LOB)     Balance  Balance: needs focused assessment  Sitting - Static: Good  Sitting - Dynamic: Good  Standing - Static: Fair  Standing - Dynamic: Fair (with handheld assistance)    Participation and Endurance  Participation Effort: good  Endurance: Tolerates 10 - 20 min exercise with multiple rests. Reported dizziness throughout mobility. No SOB noted.    AM-PACT Inpatient Short Forms  Inpatient AM-PACT Performed? (PT): Basic Mobility Inpatient Short Form  AM-PACT "6 Clicks" Basic Mobility Inpatient Short Form  Turning Over in Bed:  None  Sitting Down On/Standing From Armchair: A little  Lying on Back to Sitting on Side of Bed: None  Assist Moving to/from Bed to Chair: A little  Assist to Walk in Hospital Room: A little  Assist to Climb 3-5 Steps with Railing: A little  PT Basic Mobility Raw Score: 20  CMS 0-100% Score: 35.83%    Treatment Activities: Educated in signs/sx CVA and instructed to return to ED if symptoms occur. Educated in importance of timely presentation to ED for intervention as appropriate. Educated on importance of continued use of L UE/LE to maintain peripheral connections. Educated in and performed AP, LAQ and seated marching Bx10. Encouraged to ambulate with supervision as able to decrease effects of immobility. Educated in activity pacing due to c/o dizziness. Instructed in need for supervision upon return home, especially during stair negotiation and shower transfers to decrease risk of falls. Instructed in proper sequencing on stairs, up with R, down with L, due to L LE weakness. Instructed to obtain seat for shower for safety as well. Pt verbalized understanding for all education provided.    Educated the patient to role of physical therapy, [name] of care, goals of therapy and HEP, safety with mobility and ADLs, home safety.    At end of session pt supine in bed, call bell and items in reach. RN aware.      Delfin Edis, PT, DPT  Pager #: [phone]

## 2016-02-25 DIAGNOSIS — G43109 Migraine with aura, not intractable, without status migrainosus: Secondary | ICD-10-CM

## 2016-02-25 MED ORDER — BUTALBITAL-ASA-CAFFEINE 50-325-40 MG PO CAPS
1.0000 | ORAL_CAPSULE | Freq: Four times a day (QID) | ORAL | Status: AC | PRN
Start: 2016-02-25 — End: ?

## 2016-02-25 MED ORDER — ASPIRIN 81 MG PO TBEC
81.0000 mg | DELAYED_RELEASE_TABLET | Freq: Every day | ORAL | Status: AC
Start: 2016-02-25 — End: ?

## 2016-02-25 NOTE — Progress Notes (Signed)
Pnt ready for d/c today and will need Home PT/OT.  CM met with pnt who appears to be a&ox3 and able to make decisions.  Pnt confirmed he does not live alone but with his mother and siblings a total of 5 people in home.  Pnt states he has a strong family support system and he will have help in home.  Pnt's brother is coming to pick him up.  Pnt perfers cane over walker and will purchase one at pharmacy.  Pnt will need 1-2 PT/OT visits and referral made to [facility], [facility] liaison.  CM sent order to d/c clinic to set up follow up apptmt.  No further d/c needs identified at this time.  CM will cont to follow as needed.     Systems Case Management Progress Note:   Type of Services Provider Name   Provider Phone Number   Length of Need SCM approved by:  Comments:   Skilled Nursing Facility "SNF"        Assisted Living        LTAC        Dialysis        [facility]  [phone] 1-2 PT/OT Julious Payer, Team Lead     Infusion        DME        Medications        Transportation        Non-skilled Care ie Private Duty Aide

## 2016-02-25 NOTE — Plan of Care (Signed)
Patient Blain home as per MD orders. Instructions given to patient . Prescriptions for Aspirin and Fiorinal given to Patient . Patient and family allowed time for questions and answers. Pt verbalized understanding. Pt will follow up with [facility] care. Home health care sept up by CM.  IV line [place] with no issues. Tele box removed, monitor tech notified. Patient and family escorted to car  About 1630

## 2016-02-25 NOTE — Discharge Summary (Signed)
Discharge summary   Patient seen and examined. I have directly reviewed the clinical findings, lab, imaging studies and management of this patient in detail.   CC:  Chief Complaint   Patient presents with   . Numbness       Physical:  Filed Vitals:    02/25/16 0200 02/25/16 0523 02/25/16 0929 02/25/16 1326   BP: 116/65 109/68 127/70 126/71   Pulse: 60 49 61 72   Temp: 97.5 F (36.4 C) 97.1 F (36.2 C) 97.4 F (36.3 C) 97.6 F (36.4 C)   TempSrc: Temporal Artery Temporal Artery Temporal Artery Temporal Artery   Resp: 16 16 15 16    Height:       Weight:       SpO2: 100% 98% 99% 99%     Heart: Normal rate.  S1 normal and S2 normal.    Chest: Symmetric chest wall expansion.   Abdomen: Abdomen is soft, non-distended. Bowel sounds are normal.  There is no abdominal tenderness. There is no splenomegaly or hepatomegaly.  Mild Left lower extremity weakness, much improved    Assessment and plan/discharge diagnosis:   Neuro: Complex/ Complicated Migraine: Strokelike symptom with left-sided weakness, numbness and tingling with left sided facial droop, left eye twitching and left-sided headache: History of transient ischemic attack in 2014. MRI did not show any acute stroke, but did show an old small cerebellum infarct. MRA shows outpouching involving the internal carotid artery, which may reflect an infundibulum, but an aneurysm is not excluded. Recommend a follow-up MRA in 6-12 months. Neurology consult called with Dr. Marney Doctor; who agrees that symptoms are likely related to complex migraine. However, Dr.Rana also recommends a baby aspirin 81 mg daily due to prior history of CVA.  As recommended by Dr. Marney Doctor will send home on Fiorinal prn   'Tobacco abuse: Encouraged cessation   Diagnosis complex migraine 4 days ago to [facility]. Fiorinal prn   Disposition weakness, much improved with physical therapy.  Will send home and arrange home physical therapy.  Patient will use a cane to walk.  His sisters, stay  with them for a few days until he recuperates.    Bertrum Sol, M.D.  02/25/2016  2:40 PM

## 2016-02-25 NOTE — PT Progress Note (Signed)
[facility]  [address]  [place], [place]. [address]    Department of Rehabilitation  [phone]    Physical Therapy Daily Treatment Note    Patient: Todd [name]    MRN#: [id number]     W112/W112.A    Time of Treatment: Start Time: 1257 Stop Time: 1313 Time and Start Time: 1250  Stop Time: 1258 Calculation (min): 24 min    PT Visit Number: 2    Patient's medical condition is appropriate for Physical Therapy intervention at this time.      Precautions  Weight Bearing Status: no restrictions    Assessment:   Assessment: Decreased endurance/activity tolerance;Decreased functional mobility;Decreased balance;Gait impairment Prognosis: Good;With continued PT status post acute discharge   Progress: Improving as expected     Pt demonstrating increased strength L LE with ability to hold sustained quad contraction and ambulate without assistive device. Pt would benefit from home PT services to maximize functional outcome.       Plan:   Continue with Physical Therapy services to address strength, endurance and balance deficits.    Focus next session on transfers, dynamic standing balance activities, there ex and gait training to increase pt's functional independence and safety.    Treatment/Interventions: Gait training;Stair training;Neuromuscular re-education;Functional transfer training;LE strengthening/ROM;Endurance training   PT Frequency: 4-5x/wk     Based on today's session patient's discharge recommendation is the following: Discharge Recommendation: Home with supervision;Home with home health PT  DME Recommended for Discharge: Single point cane          Subjective: Patient is agreeable to participation in the therapy session. Nursing clears patient for therapy.   Pain Assessment  Pain Assessment: Numeric Scale (0-10)  Pain Score: 8-severe pain  POSS Score: Awake and Alert  Pain Location: Head  Pain Intervention(s): Medication (See eMAR)     Objective:  Observation of Patient/Vital Signs:  Patient  is in bed with no medical equipment in place.         Functional Mobility  Supine to Sit: Independent  Sit to Supine: Independent  Sit to Stand: Supervision  Stand to Sit: Supervision     Locomotion  Ambulation: Supervision;with front-wheeled walker  Ambulation Distance (Feet): 60 Feet  Pattern: decreased cadence;decreased step length     There Ex:  Seated:  Hip flexion x 10 each  Hip abduction x 10 each  LAQ'x x 10 each  Heel/toe raises x 10 each  .        Treatment Activities:   Pt educated in activity pacing during transitional movements to reduce the risk of developing dizziness. Pt was instructed in there ex per above with focus and facilitation on correct positioning and cadence to maximize quality of each exercise.  Pt instructed to perform exercises 2-3 times a day to increase generalized strength and endurance and to facilitate increased independence with mobility and ADL's. Pt educated on importance of continued use of LUE/LE to maintain peripheral connections. Instructed pt to have initial supervision after discharge home for high risk activities such as showering, cooking, stairs. Pt reported that his sister would be home to supervise. Pt ambulated with rw but pt reported that he would prefer to use a spc because it would be more of a challenge.  Educated pt in importance of avoiding falls and recommended that he use rw if needed the support. Pt asked if he could try spc. Pt instructed in use of SPC for mobility.  Pt instructed to hold cane on stronger/unaffected side, advance  SPC and affected LE, following through with unaffected LE. Pt demonstrated proficiency with spc while ambulating with supervision without loss of balance.  Pt ambulated w/o AD for about 30 feet without loss of balance but slightly unsteady gait and pt reported that he continues to have some dizziness.   Educated the patient to role of physical therapy, plan of care, goals of therapy and HEP, safety with mobility and ADLs, home  safety.    Patient left without needs and call bell within reach.   RN and Dr. Reatha Armour notified of session outcome.

## 2016-02-25 NOTE — Plan of Care (Signed)
Report received, assumed care, assessment ongoing .pt alert and oriented x4,  resting in bed in no apparent pain o discomfort, vss, no SOB or Resp. distress noted. iv line flushes well,  call bell in reach. Will continue to monitor hourly.

## 2016-02-25 NOTE — Discharge Instructions (Signed)
Migraines and Cluster Headaches  Migraines and cluster headaches cause intense, throbbing pain onone side of the head. With a migraine, you may have nausea and vomiting and be sensitive to light and sound. You may also have warning signs, such as flashing lights or loss of parts of your vision, before the pain starts. Migraines are three times more common in women than men. This may be due to hormonal changes during menstruation. Typical migrains may last for 4 to 72 hours untreated.  Cluster headaches recur in groups for days, weeks, or months. The pain is centered around or behindone eye. The eye may also become red or teary, or the eyelid may droop.Migraines and cluster headaches can have many triggers.    Preventing migraines and cluster headaches  Try the following steps:   Avoid aged cheeses, nuts, beans, chocolate, red wine, or foods that contain caffeine, alcohol, tobacco, nitrates, and MSG.   Try not to skip meals.   Don't work in poor lighting.   Reduce stress as much as you can.   Get plenty of sleep each night.   Exercise regularly under your doctor's guidance.   Avoid taking headache medicines for more than 3 days, because of the risk of rebound headaches.  Relieving the pain  Try these suggestions:   Stay quiet and rest.   Use cold to numb the pain. Wrap ice or a cold can of soda in a cloth. Hold it against the site of pain for10 minutes. Repeat every 20 minutes.   Avoid light. Wear dark glasses, turn out lights, and close the curtains. When outdoors, wear a brimmed hat.   Drink lots of fluids. Sip caffeine-free flat soda to help relieve nausea.   See your doctor if you get migraines or cluster headaches often. There are effective medications to help treat or prevent them.   Hormone therapy. This may help women whose migraines are related to hormonal changes during menstruation.  Date Last Reviewed: 05/26/2014   2000-2016 [employer] [address], [place],  [place] [address]. All rights reserved. This information is not intended as a substitute for professional medical care. Always follow your healthcare professional's instructions.        What Are Migraine and Tension Headaches?  Although there are several types of headaches, migraine and tension headaches affect the most people. When you have a headache, it isn't your brain that's hurting. Your head aches because nerves in the bones, blood vessels, meninges, and muscles of your head are irritated. These irritated nerves send pain signals to the brain, which identifies where you hurt and how bad the pain is.  Talk with your healthcare provider about a treatment plan that may help relieve pain and prevent future headaches.     What causes your headache?  The actual headache process is not yet understood.Only rarely are headaches a sign of a serious medical problem. Headache pain may be caused by abnormal interaction between the brain and the nerves and blood vessels in the head. Environmental stresses or certain foods and drinks may trigger headache pain.  What is referred pain?  Headache pain can be referred pain, which ispain that has its source in one place but is felt in another. For example, pain behind the eyes may actually be caused by tense muscles in the neck and shoulders. This means that the place that hurts may not be the part of the body that needs treatment.  Is it a migraine?  Migraine is a  vascular headache that causes throbbing pain felt on one or both sides of the head. You may feel nauseated or vomit. This headache may also be preceded or associated with changes in sight (like seeing spots or flashes of light), ability to speak, or sensation (aura). There are a wide variety of environmental and food-related triggers for migraines. The pain may last for 4 to 72 hours. Afterward, you may feel shaky for a day or so. If this is the first time you experience these symptoms, you should immediately seek medical  attention becauseyou could be having a stroke.  Is it a tension headache?  This type of headache is usually a dull ache or a sensation of pressure on both sides of the head. It may be associated with pain or tension in the neck and shoulders. Depression, anxiety, and stress can cause a tension headache. The pain may not have a definite beginning or end. It may come and go, or seem never to go away.  When to call the healthcare provider  Call yourhealthcare providerfor headaches thathappen along with any of these symptoms:   Sudden, severe headache that is different from your usual headache pain   High fever along with a stiff neck   Recurring headache in children   Ongoing numbness or muscle weakness   Loss of vision   Pain following a head injury   Convulsions, or a change in mental awareness   A headache you would call "the worst headache you've ever had"   Date Last Reviewed: 04/21/2014   2000-2016 [employer] [address] [place], [place] [address]. All rights reserved. This information is not intended as a substitute for professional medical care. Always follow your healthcare professional's instructions.      When you go to your appointment at [facility]:   Please arrive 20 minutes early for your appointment. We recommend you bring income information with you. We are NOT able to provide free medications. But if you have proof of income, we may be able to assist you with application(s) for prescription assistance. This is a temporary clinic. We will work with you to transition into a community clinic that will serve as your primary care provider.  Home Health Discharge Information    Your doctor has ordered Physical Therapy and Occupational Therapy in-home service(s) for you while you recuperate at home, to assist you in the transition from hospital to home.      The agency that you or your representative chose to provide the service:  Name of Home Health Agency:  [facility] ([phone])]    Approved for Systems Case Management PT visit times 1 to 2 and OT visits times 1-2 per Nedra Hai O'neill CM and Clayburn Pert Polina CM Team Lead.      The above services were set up by:  Julien Girt [facility] Liaison) Phone [phone]     Additional comments:   If you have not heard from your home health agency within 24-48 hours after discharge please call your agency to arrange a time for your first visit. For any scheduling concerns or questions related to home health, such as time or date please contact your home health agency at the number listed above.     Signed by: Julien Girt RN, BSN  Date Time: 02/25/2016 3:19 PM

## 2016-02-25 NOTE — SLP Eval Note (Signed)
[facility]  [address]  [place], [place]. [address]    Department of Rehabilitation Services  [phone]    Speech and Language Therapy Evaluation    Patient: Todd [name]    MRN#: [id number]     Time of treatment:   SLP Received On: 02/25/16  Start Time: 1230  Stop Time: 1300  Time Calculation (min): 30 min    SLP Visit Number:  (1)    Consult received for [name] for SLP Evaluation and Treatment.      Assessment:   1. WFL oropharyngeal phase skills- even adequate with straw  2. WFL receptive and expressive language deficits; WFL cognition.  3. Pt with at least mild dysfluency- pt stating that he has not been dysfluent before. Family contact stated that pt does have a h/o dysfluency but it has been exacerbated this admission.  4. Pt with c/o blurry vision in the L eye only      Plan/ Recommendations:    SLP Frequency Recommended:  (SLP to follow while here)  -If pt does go to acute rehab then he should receive continued SLP dx/tx for dysfluency, as part of his comprehensive rehab. If pt goes home, then suggest outpatient or home SLP dx/tx.  -SLP to see while pt is here to further assess reading comprehension and oral reading, as well as to address dysfluency.    Goals: Goals to be achieved in     1. Pt will complete word and phrase level imitation tasks with 90% X3    2. Pt will produce Easy Relaxed Speech (ERS) in 2-4 word phrases during structured activities.  3. Pt will spontaneously use ERS 10-15X during conversational level tasks.        Expected disposition: Recommendations  Recommendations: Outpatient speech (or inpt if pt goes to acute rehab)  SLP Frequency Recommended:  (SLP to follow while here)     Discharge Recommendations:   Recommendations: Outpatient speech (or inpt if pt goes to acute rehab)      Current Hospitalization:  Referring Physician: Alemayehu, B.  Date of Referral: 02/25/16    Medical Diagnosis: Left arm numbness [R20.0]  Left leg numbness [R20.0]  Left eye  complaint [H57.9]    History of Present Illness: Todd [name] is a 35 y.o. male admitted on 02/23/2016 with past medical history see above comes into the ER complaining of new stroke signs and symptoms.  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 Does not plan any antiplatelets or anticoagulation then. He says 4 days ago he was having a left-sided headache with left eye twitching and photosensitivity so he went to [facility] for where he ruled out for acute CVA, but was diagnosed with a transient ischemic attack but apparently not put on any aspirin then. The night before admission, while  watching a movie with his mother, he started having again the left-sided headache with left eye twitching like he could not close his eye, but he thought it was related to stress, so he went to bed. When he woke up the morning, he did not have the symptoms, but he just felt "off".  He went to work and when he got to work, he again started having the left-sided headache with left eye twitching and when he went to lunch at [employer] around noon time.  His coworker noted that he had a left sided  facial droop and patient started complaining of left-sided arm and leg weakness with numbness and tingling on his left face, arm and leg. He has no more droop or eye twitching issues, but still feels numb on his left side with mild focal weakness noted.  He denies any other recent illnesses or recent travel. Family history of CVA and denies personal history of hypertension, hyperlipidemia or diabetes.  Patient is not a TPA candidate due to being out of the TPA window.     Patient Active Problem List   Diagnosis   . Left-sided weakness   . Left leg numbness   . Left eye complaint   . Left arm numbness   . Hx-TIA (transient ischemic attack)   . History of migraine   . Tobacco abuse   . Complicated migraine        Past Medical/Surgical  History:  Past Medical History   Diagnosis Date   . Hx-TIA (transient ischemic attack)    . History of migraine    . Tobacco abuse       History reviewed. No pertinent past surgical history.      Social History:   -lives with mother  -Conservation officer, nature  -works in the field of outdoor labor       Subjective:    Patient is agreeable to participation in the therapy session. Patient's medical condition is appropriate for Speech therapy intervention at this time.  "I have a really bad headache, could you leave the lights off?"       Objective:   Observation of Patient/Vital Signs:  Patient is in bed with peripheral IV in place.    Cognitive Status and Neuro Exam:  Cognition  Arousal/Alertness: Appropriate responses to stimuli  Attention Span: Appears intact  Orientation Level: Oriented X4  Memory: Appears intact  Following Commands: Follows all commands and directions without difficulty  Safety Awareness: independent  Insights: Fully aware of deficits  Problem Solving: Able to problem solve independently    Oral Motor Assessment:  Oral/Motor  Labial ROM: Within Functional Limits  Labial Symmetry: Within Functional Limits  Labial Strength: Within Functional Limits  Labial Sensation: Reduced (+ report of L sided tingling)  Lingual ROM: Within Functional Limits  Lingual Symmetry: Within Functional Limits  Lingual Strength: Within Functional Limits  Lingual Sensation: Within Functional Limits  Velum: Within Functional Limits  Mandible: Within Functional Limits  Facial ROM: Reduced right (though pt c/o L sided tingling, R sided )  Facial Symmetry: Right droop  Facial Sensation: Reduced (on L)  Vocal Quality: Within Functional Limits  Vocal Intensity: Within Functional Limits  Gag: Within Functional Limits  Apraxia:  (? mild verbal)  Intelligibility: Intelligible (dysfluent)  Breath Support: Adequate for speech  Dentition: Adequate  Hearing: Within Functional Limits    Auditory Comprehension:  Auditory  Comprehension  Auditory Comprehension: Within Functional Limits  Commands: Within Functional Limits    Reading Comprehension:  Reading Comprehension  Reading Status: Exceptions to [facility]  Scanning/Tracking Impairment Severity:  (?)  Effective Techniques:  (reading comprehension not formally assessed though pt was dysfluent and had difficulty with oral reading tasks)    Expression:  Expression  Primary Mode of Expression: Verbal    Verbal Expression  Verbal Expression  Single Words: WFL  Connected Speech: Exceptions to [facility]  Expressing Basic Needs: WFL  Expressing Simple Ideas: WFL  Expressing Complex Ideas: Mild  Initiation: WFL  Repetition: [facility]    Written Expression  Written Expression  Dominant Hand: Right  Written Expression: Within Functional Limits         Behavior:  Behavior  Attention: Within Functional Limits  Memory: Within Funtional Limits  Problem Solving: Within Functional Limits  Numeric Reasoning: Within Functional Limits  Abstract Reasoning: Within Functional Limits  Safety/Judgement: Within Functional Limits  Insight: Within function limits  Impulsive: Within functional limits  Flexibility of Thought: Within functional limits  Planning: Within functional limits  Organization: Within functional limits    Additional Assessments:       A CLINICAL SWALLOW EVALUATION WAS CONDUCTED: (per RN, pt was coughing with straw sips of thin liquids)    Pt was presented with the following foods/liquids:  Oral Phase- Adequate AP transit of both solids and liquids; pt also with adequate mastication of solids; no oral residual with either solids or liquids; no anterior bolus loss  Pharyngeal Phase- Timely and coordinated swallow response for both liquids and solids; pt with adequate laryngeal elevation; clear breath sounds and vocal quality pre and post swallow, to cervical auscultation; no cough or choke with solids or liquids; vitals remained unchanged. Note that this session, pt demonstrated the ability to use a straw.           Treatment Activities: initiated.    Educated the patient to role of speech therapy, plan of care, and goals of therapy.    Doralee Albino. Margo Aye, MS CCC-SLP

## 2016-02-25 NOTE — Progress Notes (Signed)
Home Health Referral          Referral from Estevan Oaks (Case Manager) for home health care upon discharge.    By [employer], the patient has the right to freely choose a home care provider.  Arrangements have been made with:     A company of the patients choosing. We have supplied the patient with a listing of providers in your area who asked to be included and participate in Medicare.   [facility], a home care agency that provides both adult home care services which is a wholly owned and operated by [employer] and participates in [employer]   The preferred provider of your insurance company. Choosing a home care provider other than your insurance company's preferred provider may affect your insurance coverage.    The Home Health Care Referral Form acknowledging the voluntary selection of the home care company has been completed, signed, and is on file.      Home Health Discharge Information     Your doctor has ordered Physical Therapy and Occupational Therapy in-home service(s) for you while you recuperate at home, to assist you in the transition from hospital to home.      The agency that you or your representative chose to provide the service:  Name of Home Health Agency: [facility] [phone])]    Approved for Systems Case Management PT visit times 1 to 2 and OT visits times 1-2 per Nedra Hai O'neill CM and Clayburn Pert Polina CM Team Lead.      The above services were set up by:  Julien Girt  [facility] Liaison)   Phone      [phone]                                       Additional comments:   If you have not heard from your home health agency within 24-48 hours after discharge please call your agency to arrange a time for your first visit.  For any scheduling concerns or questions related to home health, such as time or date please contact your home health agency at the number listed above.     Signed by: Julien Girt RN, BSN  Date Time: 02/25/2016 3:19 PM

## 2016-02-29 ENCOUNTER — Ambulatory Visit (INDEPENDENT_AMBULATORY_CARE_PROVIDER_SITE_OTHER): Payer: Self-pay | Admitting: Family

## 2019-04-22 ENCOUNTER — Emergency Department (HOSPITAL_COMMUNITY): Admission: EM | Admit: 2019-04-22 | Discharge: 2019-04-23 | Discharge status: Against Medical Advice | Payer: Self-pay

## 2019-04-22 ENCOUNTER — Other Ambulatory Visit: Payer: Self-pay

## 2019-04-22 NOTE — ED Notes (Signed)
Called for triage without response

## 2019-04-22 NOTE — ED Notes (Signed)
Pt was called to be triaged. Pt. Did not answer and is presumed not on the grounds or in the facility.

## 2019-04-23 ENCOUNTER — Emergency Department (HOSPITAL_COMMUNITY): Payer: Self-pay

## 2019-04-23 ENCOUNTER — Other Ambulatory Visit: Payer: Self-pay

## 2019-04-23 ENCOUNTER — Emergency Department (HOSPITAL_COMMUNITY)
Admission: EM | Admit: 2019-04-23 | Discharge: 2019-04-23 | Disposition: A | Discharge status: Home / Self Care | Payer: Self-pay | Attending: Emergency Medicine | Admitting: Emergency Medicine

## 2019-04-23 ENCOUNTER — Encounter (HOSPITAL_COMMUNITY): Payer: Self-pay | Admitting: Emergency Medicine

## 2019-04-23 DIAGNOSIS — Z20828 Contact with and (suspected) exposure to other viral communicable diseases: Secondary | ICD-10-CM | POA: Insufficient documentation

## 2019-04-23 DIAGNOSIS — Z8673 Personal history of transient ischemic attack (TIA), and cerebral infarction without residual deficits: Secondary | ICD-10-CM | POA: Insufficient documentation

## 2019-04-23 DIAGNOSIS — B349 Viral infection, unspecified: Secondary | ICD-10-CM | POA: Insufficient documentation

## 2019-04-23 DIAGNOSIS — R05 Cough: Secondary | ICD-10-CM | POA: Insufficient documentation

## 2019-04-23 HISTORY — DX: Transient cerebral ischemic attack, unspecified: G45.9

## 2019-04-23 HISTORY — DX: Thrombocytopenia, unspecified: D69.6

## 2019-04-23 LAB — BASIC METABOLIC PANEL
Anion gap: 11 (ref 5–15)
BUN: 14 mg/dL (ref 6–20)
CO2: 23 mmol/L (ref 22–32)
Calcium: 9.2 mg/dL (ref 8.9–10.3)
Chloride: 105 mmol/L (ref 98–111)
Creatinine, Ser: 1.05 mg/dL (ref 0.61–1.24)
GFR calc Af Amer: >60 mL/min (ref >60)
GFR calc non Af Amer: >60 mL/min (ref >60)
Glucose, Bld: 114 mg/dL — ABNORMAL HIGH (ref 70–99)
Potassium: 4.4 mmol/L (ref 3.5–5.1)
Sodium: 139 mmol/L (ref 135–145)

## 2019-04-23 LAB — CBC
HCT: 40.6 % (ref 39.0–52.0)
Hemoglobin: 13.7 g/dL (ref 13.0–17.0)
MCH: 30.3 pg (ref 26.0–34.0)
MCHC: 33.7 g/dL (ref 30.0–36.0)
MCV: 89.8 fL (ref 80.0–100.0)
Platelets: 228 10*3/uL (ref 150–400)
RBC: 4.52 MIL/uL (ref 4.22–5.81)
RDW: 14.7 % (ref 11.5–15.5)
WBC: 4.3 10*3/uL (ref 4.0–10.5)
nRBC: 0 % (ref 0.0–0.2)

## 2019-04-23 LAB — TROPONIN I (HIGH SENSITIVITY)
Troponin I (High Sensitivity): 3 ng/L (ref <18)
Troponin I (High Sensitivity): 3 ng/L (ref <18)

## 2019-04-23 MED ORDER — ONDANSETRON 4 MG PO TBDP
4.0000 mg | ORAL_TABLET | Freq: Once | ORAL | Status: AC
  Administered 2019-04-23: 4 mg via ORAL
  Filled 2019-04-23: qty 1

## 2019-04-23 MED ORDER — ONDANSETRON HCL 4 MG PO TABS: 4.0000 mg | ORAL_TABLET | Freq: Four times a day (QID) | ORAL | 0 refills | Status: AC

## 2019-04-23 MED ORDER — SODIUM CHLORIDE 0.9% FLUSH: 3.0000 mL | Freq: Once | INTRAVENOUS | Status: DC

## 2019-04-23 NOTE — ED Triage Notes (Signed)
Patient reports multiple symptoms - states he's felt lightheaded and generally weak for the past 2 weeks. Endorses new onset chest tightness yesterday followed by nausea, cough (phlegm), and generalized body aches. Denies fevers/chills. Took Tylenol last night without relief. Resp e/u, skin w/d. Adds that he's had "three mini strokes in the past and low platelet count."

## 2019-04-23 NOTE — Discharge Instructions (Signed)
Please read attached information. If you experience any new or worsening signs or symptoms please return to the emergency room for evaluation. Please follow-up with your primary care provider or specialist as discussed. Please use medication prescribed only as directed and discontinue taking if you have any concerning signs or symptoms.   °

## 2019-04-23 NOTE — ED Notes (Signed)
Verified with Charge RN that the pt will need to be re-registered, the pt states, "I fell asleep in my car last night." the pt was told that he would need to be re-registered and that we would be happy to see him once a room is available today

## 2019-04-23 NOTE — ED Provider Notes (Signed)
[facility]   CSN: [id number] Arrival date & time: 04/23/19  1004     History   Chief Complaint Chief Complaint  Patient presents with  . Cough  . Generalized Body Aches    HPI Cameron Silva is a 38 y.o. male.     HPI    38 year old male presents today with complaints of generalized body aches.  Patient notes symptoms started approximate 1.5 weeks ago when he is feeling slightly fatigued.  He notes her last 2 days he has developed cough, body aches, and feeling hot.  Denies any objective fever at home.  He notes taking Tylenol yesterday no medications today.  He denies any close sick contacts or known COVID-19 exposure.  His significant other is at bedside and notes she has no similar symptoms.  He denies any shortness of breath.   Past Medical History:  Diagnosis Date  . Thrombocytopenia (HCC)   . TIA (transient ischemic attack)    reports 3 mini strokes in past    There are no active problems to display for this patient.        Home Medications    Prior to Admission medications   Medication Sig Start Date End Date Taking? Authorizing Provider  ondansetron (ZOFRAN) 4 MG tablet Take 1 tablet (4 mg total) by mouth every 6 (six) hours. 04/23/19   Eyvonne MechanicHedges, Dorotha Hirschi, PA-C    Family History No family history on file.  Social History Social History   Tobacco Use  . Smoking status: Not on file  Substance Use Topics  . Alcohol use: Not on file  . Drug use: Not on file     Allergies   Patient has no known allergies.   Review of Systems Review of Systems  All other systems reviewed and are negative.    Physical Exam Updated Vital Signs BP 124/68 (BP Location: Right Arm)   Pulse (!) 58   Temp 98.6 F (37 C) (Oral)   Resp 15   SpO2 100%   Physical Exam Vitals signs and nursing note reviewed.  Constitutional:      Appearance: He is well-developed.  HENT:     Head: Normocephalic and  atraumatic.  Eyes:     General: No scleral icterus.       Right eye: No discharge.        Left eye: No discharge.     Conjunctiva/sclera: Conjunctivae normal.     Pupils: Pupils are equal, round, and reactive to light.  Neck:     Musculoskeletal: Normal range of motion.     Vascular: No JVD.     Trachea: No tracheal deviation.  Pulmonary:     Effort: Pulmonary effort is normal. No respiratory distress.     Breath sounds: Normal breath sounds. No stridor. No wheezing or rales.  Neurological:     Mental Status: He is alert and oriented to person, place, and time.     Coordination: Coordination normal.  Psychiatric:        Behavior: Behavior normal.        Thought Content: Thought content normal.        Judgment: Judgment normal.      ED Treatments / Results  Labs (all labs ordered are listed, but only abnormal results are displayed) Labs Reviewed  BASIC METABOLIC PANEL - Abnormal; Notable for the following components:      Result Value   Glucose, Bld 114 (*)    All other  components within normal limits  NOVEL CORONAVIRUS, NAA (HOSP ORDER, SEND-OUT TO REF LAB; TAT 18-24 HRS)  CBC  TROPONIN I (HIGH SENSITIVITY)  TROPONIN I (HIGH SENSITIVITY)    EKG None  Radiology Dg Chest 2 View  Result Date: 04/23/2019 CLINICAL DATA:  Chest pain. EXAM: CHEST - 2 VIEW COMPARISON:  No prior. FINDINGS: Mediastinum and hilar structures normal. Lungs are clear. No pleural effusion or pneumothorax. Heart size normal. No acute bony abnormality. IMPRESSION: No acute abnormality. Electronically Signed   By: Marcello Moores  Register   On: 04/23/2019 10:57    Procedures Procedures (including critical care time)  Medications Ordered in ED Medications  sodium chloride flush (NS) 0.9 % injection 3 mL (has no administration in time range)  ondansetron (ZOFRAN-ODT) disintegrating tablet 4 mg (has no administration in time range)     Initial Impression / Assessment and Plan / ED Course  I have  reviewed the triage vital signs and the nursing notes.  Pertinent labs & imaging results that were available during my care of the patient were reviewed by me and considered in my medical decision making (see chart for details).        Labs:   Imaging:  Consults:  Therapeutics:  Discharge Meds:   Assessment/Plan: 38 year old male presents today with likely viral illness.  Patient will be tested for COVID, he has no signs of acute bacterial infection is well-appearing no acute distress.  Chest x-ray normal vital signs reassuring.  Return precautions given.  Verbalized understanding and agreement to today's plan had no further questions or concerns.   Final Clinical Impressions(s) / ED Diagnoses   Final diagnoses:  Viral illness    ED Discharge Orders         Ordered    ondansetron (ZOFRAN) 4 MG tablet  Every 6 hours     04/23/19 1548           Okey Regal, PA-C 04/23/19 1550    Drenda Freeze, MD 04/24/19 [phone]

## 2019-04-23 NOTE — ED Notes (Signed)
Pt may be outside with his family. Right outside of ER entrance

## 2019-04-24 LAB — NOVEL CORONAVIRUS, NAA (HOSP ORDER, SEND-OUT TO REF LAB; TAT 18-24 HRS): SARS-CoV-2, NAA: NOT DETECTED

## 2019-05-22 ENCOUNTER — Other Ambulatory Visit: Payer: Self-pay

## 2019-05-22 ENCOUNTER — Ambulatory Visit (HOSPITAL_COMMUNITY)
Admission: EM | Admit: 2019-05-22 | Discharge: 2019-05-22 | Disposition: A | Discharge status: Home / Self Care | Payer: Medicaid Other | Attending: Family Medicine | Admitting: Family Medicine

## 2019-05-22 ENCOUNTER — Encounter (HOSPITAL_COMMUNITY): Payer: Self-pay | Admitting: Emergency Medicine

## 2019-05-22 DIAGNOSIS — J069 Acute upper respiratory infection, unspecified: Secondary | ICD-10-CM | POA: Diagnosis not present

## 2019-05-22 DIAGNOSIS — Z8673 Personal history of transient ischemic attack (TIA), and cerebral infarction without residual deficits: Secondary | ICD-10-CM | POA: Insufficient documentation

## 2019-05-22 DIAGNOSIS — R059 Cough, unspecified: Secondary | ICD-10-CM

## 2019-05-22 DIAGNOSIS — R0989 Other specified symptoms and signs involving the circulatory and respiratory systems: Secondary | ICD-10-CM | POA: Diagnosis present

## 2019-05-22 DIAGNOSIS — R05 Cough: Secondary | ICD-10-CM

## 2019-05-22 DIAGNOSIS — Z20828 Contact with and (suspected) exposure to other viral communicable diseases: Secondary | ICD-10-CM | POA: Insufficient documentation

## 2019-05-22 HISTORY — DX: Pneumothorax, unspecified: J93.9

## 2019-05-22 MED ORDER — BENZONATATE 100 MG PO CAPS: 100.0000 mg | ORAL_CAPSULE | Freq: Three times a day (TID) | ORAL | 0 refills | Status: AC | PRN

## 2019-05-22 MED ORDER — PROMETHAZINE-DM 6.25-15 MG/5ML PO SYRP: 5.0000 mL | ORAL_SOLUTION | Freq: Three times a day (TID) | ORAL | 0 refills | Status: AC | PRN

## 2019-05-22 NOTE — ED Notes (Signed)
Nasal swab in lab 

## 2019-05-22 NOTE — Discharge Instructions (Addendum)
We will manage this as a viral syndrome. For sore throat or cough try using a honey-based tea. Use 3 teaspoons of honey with juice squeezed from half lemon. Place shaved pieces of ginger into 1/2-1 cup of water and warm over stove top. Then mix the ingredients and repeat every 4 hours as needed. Please take Tylenol 500mg every 6 hours. Hydrate very well with at least 2 liters of water. Eat light meals such as soups to replenish electrolytes and soft fruits, veggies. Start an antihistamine like Zyrtec, Allegra or Claritin for postnasal drainage, sinus congestion.  You can take this together with pseudoephedrine (Sudafed) at a dose of 60 mg 3 times a day oral 120 mg twice daily as needed for the same kind of congestion.   °

## 2019-05-22 NOTE — ED Triage Notes (Signed)
Patient reports not feeling well.  Onset Saturday of weakness, chest congestion, headaches, cough and sneezing.   Patient reports fever "over 99 something"

## 2019-05-22 NOTE — ED Provider Notes (Signed)
MRN: [id number] DOB: 11-Aug-1980  Subjective:   Cameron Silva is a 38 y.o. male presenting for 3-day history of cute onset worsening persistent malaise.  Of note, patient had similar episode in September and had negative work-up including negative troponin, negative coronavirus testing.  Has tried over-the-counter medications with minimal relief.  Patient does have some exposure to the general public, no known COVID-19 contacts.  Patient does smoke tobacco and has occasional drink of alcohol.   No current facility-administered medications for this encounter.   Current Outpatient Medications:  .  ondansetron (ZOFRAN) 4 MG tablet, Take 1 tablet (4 mg total) by mouth every 6 (six) hours., Disp: 12 tablet, Rfl: 0    No Known Allergies   Past Medical History:  Diagnosis Date  . Pneumothorax   . Thrombocytopenia (HCC)   . TIA (transient ischemic attack)    reports 3 mini strokes in past     Past Surgical History:  Procedure Laterality Date  . SPLENECTOMY      Review of Systems  Constitutional: Positive for fever and malaise/fatigue.  HENT: Positive for congestion. Negative for ear pain, sinus pain and sore throat.   Eyes: Negative for discharge and redness.  Respiratory: Positive for cough. Negative for hemoptysis, shortness of breath and wheezing.   Cardiovascular: Negative for chest pain.  Gastrointestinal: Negative for abdominal pain, diarrhea, nausea and vomiting.  Genitourinary: Negative for dysuria, flank pain and hematuria.  Musculoskeletal: Negative for myalgias.  Skin: Negative for rash.  Neurological: Negative for dizziness, weakness and headaches.  Psychiatric/Behavioral: Negative for depression and substance abuse.    Objective:   Vitals: BP 113/68 (BP Location: Left Arm)   Pulse 61   Temp 98 F (36.7 C) (Oral)   Resp 20   Pulse oximetry was 99% by RN Rosey Bath.   Physical Exam Constitutional:      General: He is not in acute distress.    Appearance:  Normal appearance. He is well-developed. He is not ill-appearing, toxic-appearing or diaphoretic.  HENT:     Head: Normocephalic and atraumatic.     Right Ear: External ear normal.     Left Ear: External ear normal.     Nose: Nose normal.     Mouth/Throat:     Mouth: Mucous membranes are moist.     Pharynx: Oropharynx is clear.  Eyes:     General: No scleral icterus.    Extraocular Movements: Extraocular movements intact.     Pupils: Pupils are equal, round, and reactive to light.  Cardiovascular:     Rate and Rhythm: Normal rate and regular rhythm.     Heart sounds: Normal heart sounds. No murmur. No friction rub. No gallop.   Pulmonary:     Effort: Pulmonary effort is normal. No respiratory distress.     Breath sounds: Normal breath sounds. No stridor. No wheezing, rhonchi or rales.  Neurological:     Mental Status: He is alert and oriented to person, place, and time.  Psychiatric:        Mood and Affect: Mood normal.        Behavior: Behavior normal.        Thought Content: Thought content normal.     Assessment and Plan :   1. Cough   2. Chest congestion   3. Viral URI with cough     Will manage for viral illness. Counseled patient on nature of COVID-19 including modes of transmission, diagnostic testing, management and supportive care.  Offered symptomatic relief. COVID  19 testing is pending. Counseled patient on potential for adverse effects with medications prescribed/recommended today, ER and return-to-clinic precautions discussed, patient verbalized understanding.     Jaynee Eagles, PA-C 05/23/19 1017

## 2019-05-26 LAB — NOVEL CORONAVIRUS, NAA (HOSP ORDER, SEND-OUT TO REF LAB; TAT 18-24 HRS): SARS-CoV-2, NAA: NOT DETECTED

## 2019-07-18 ENCOUNTER — Other Ambulatory Visit: Payer: Self-pay | Admitting: Family Medicine

## 2019-07-18 ENCOUNTER — Ambulatory Visit: Payer: Self-pay

## 2019-07-18 ENCOUNTER — Other Ambulatory Visit: Payer: Self-pay

## 2019-07-18 DIAGNOSIS — M25512 Pain in left shoulder: Secondary | ICD-10-CM

## 2019-10-27 IMAGING — DX DG CHEST 2V
2 series · 2 of 2 positions shown · non-contrast
Comparison: No prior.

CLINICAL DATA: Chest pain.

EXAM:
CHEST - 2 VIEW

[chest pa]
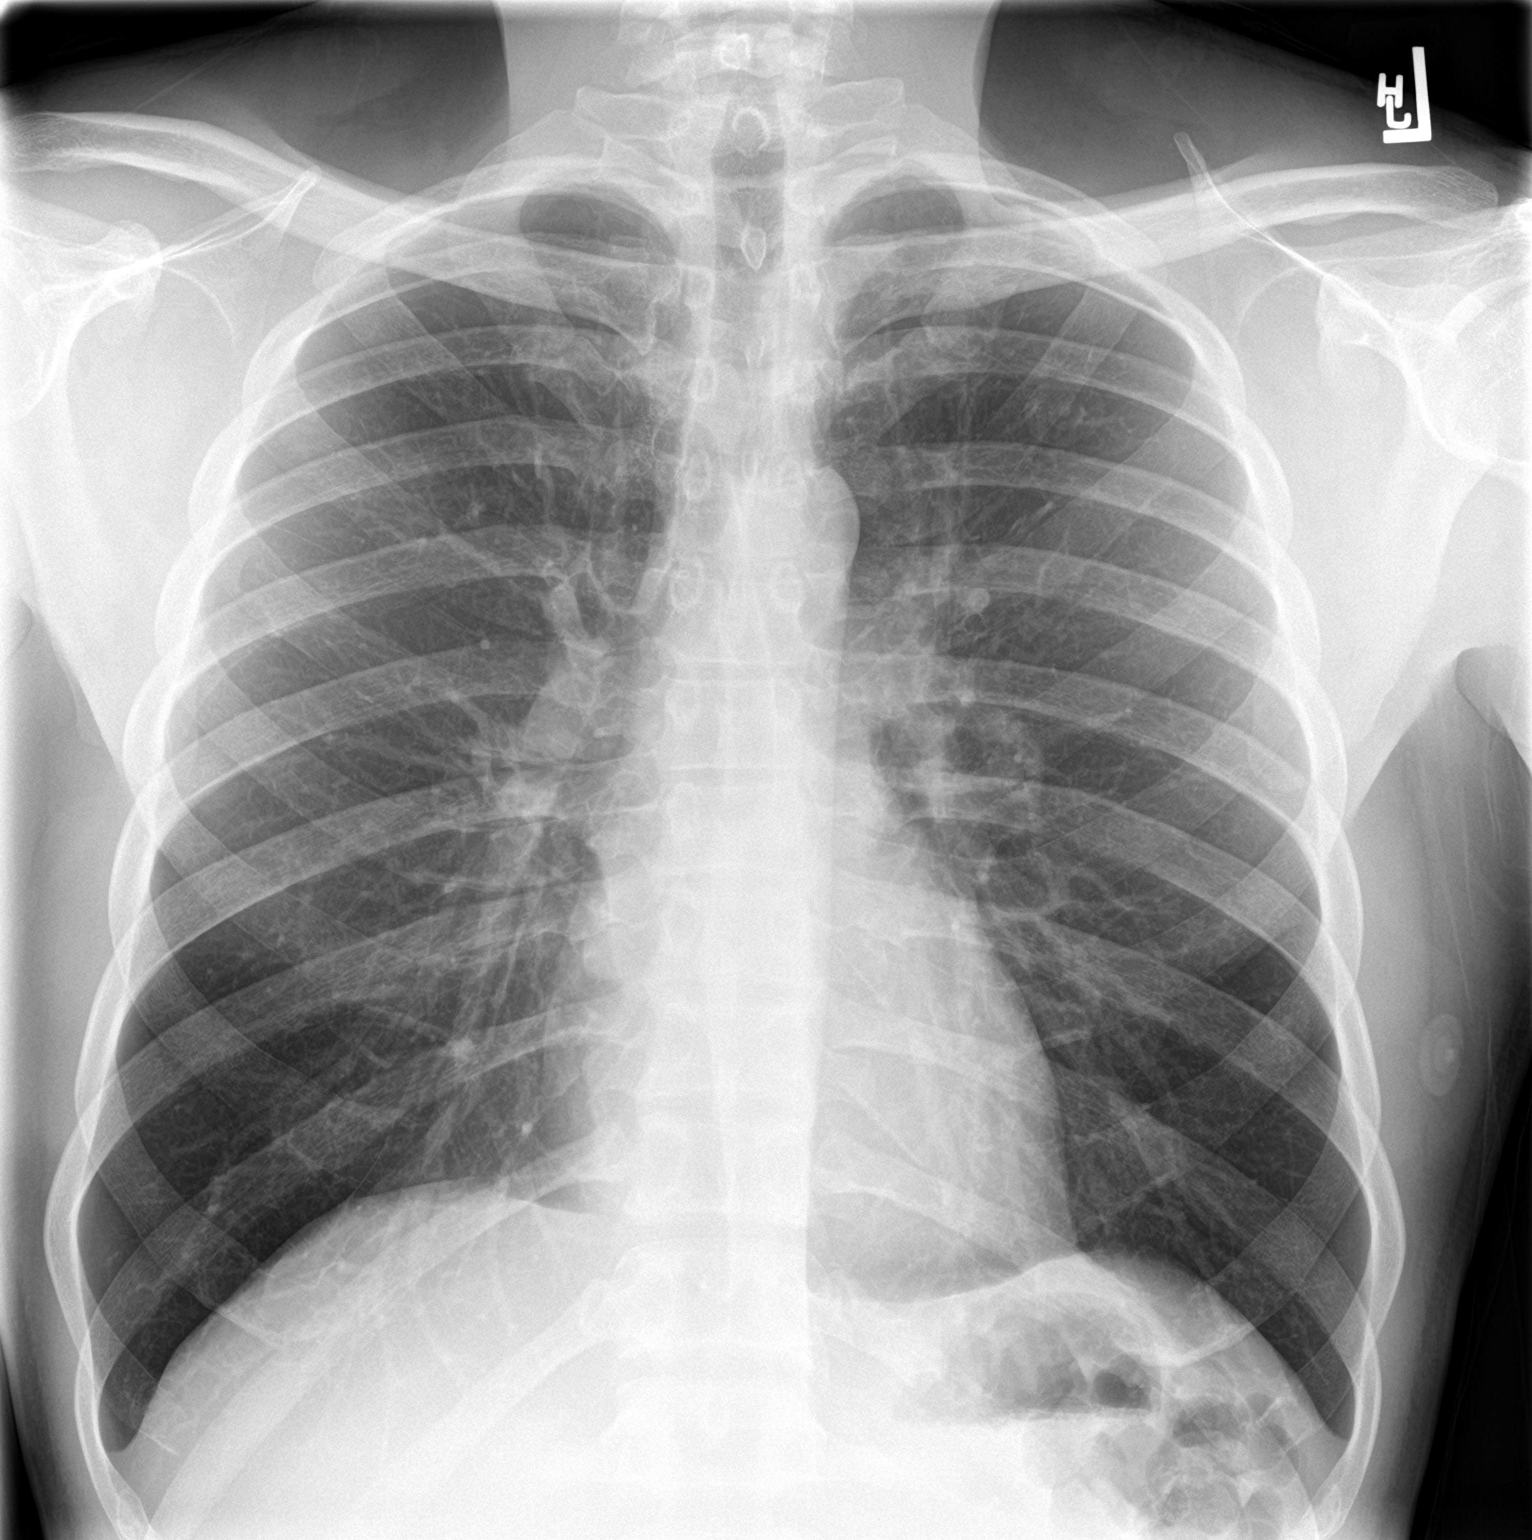

[chest lat]
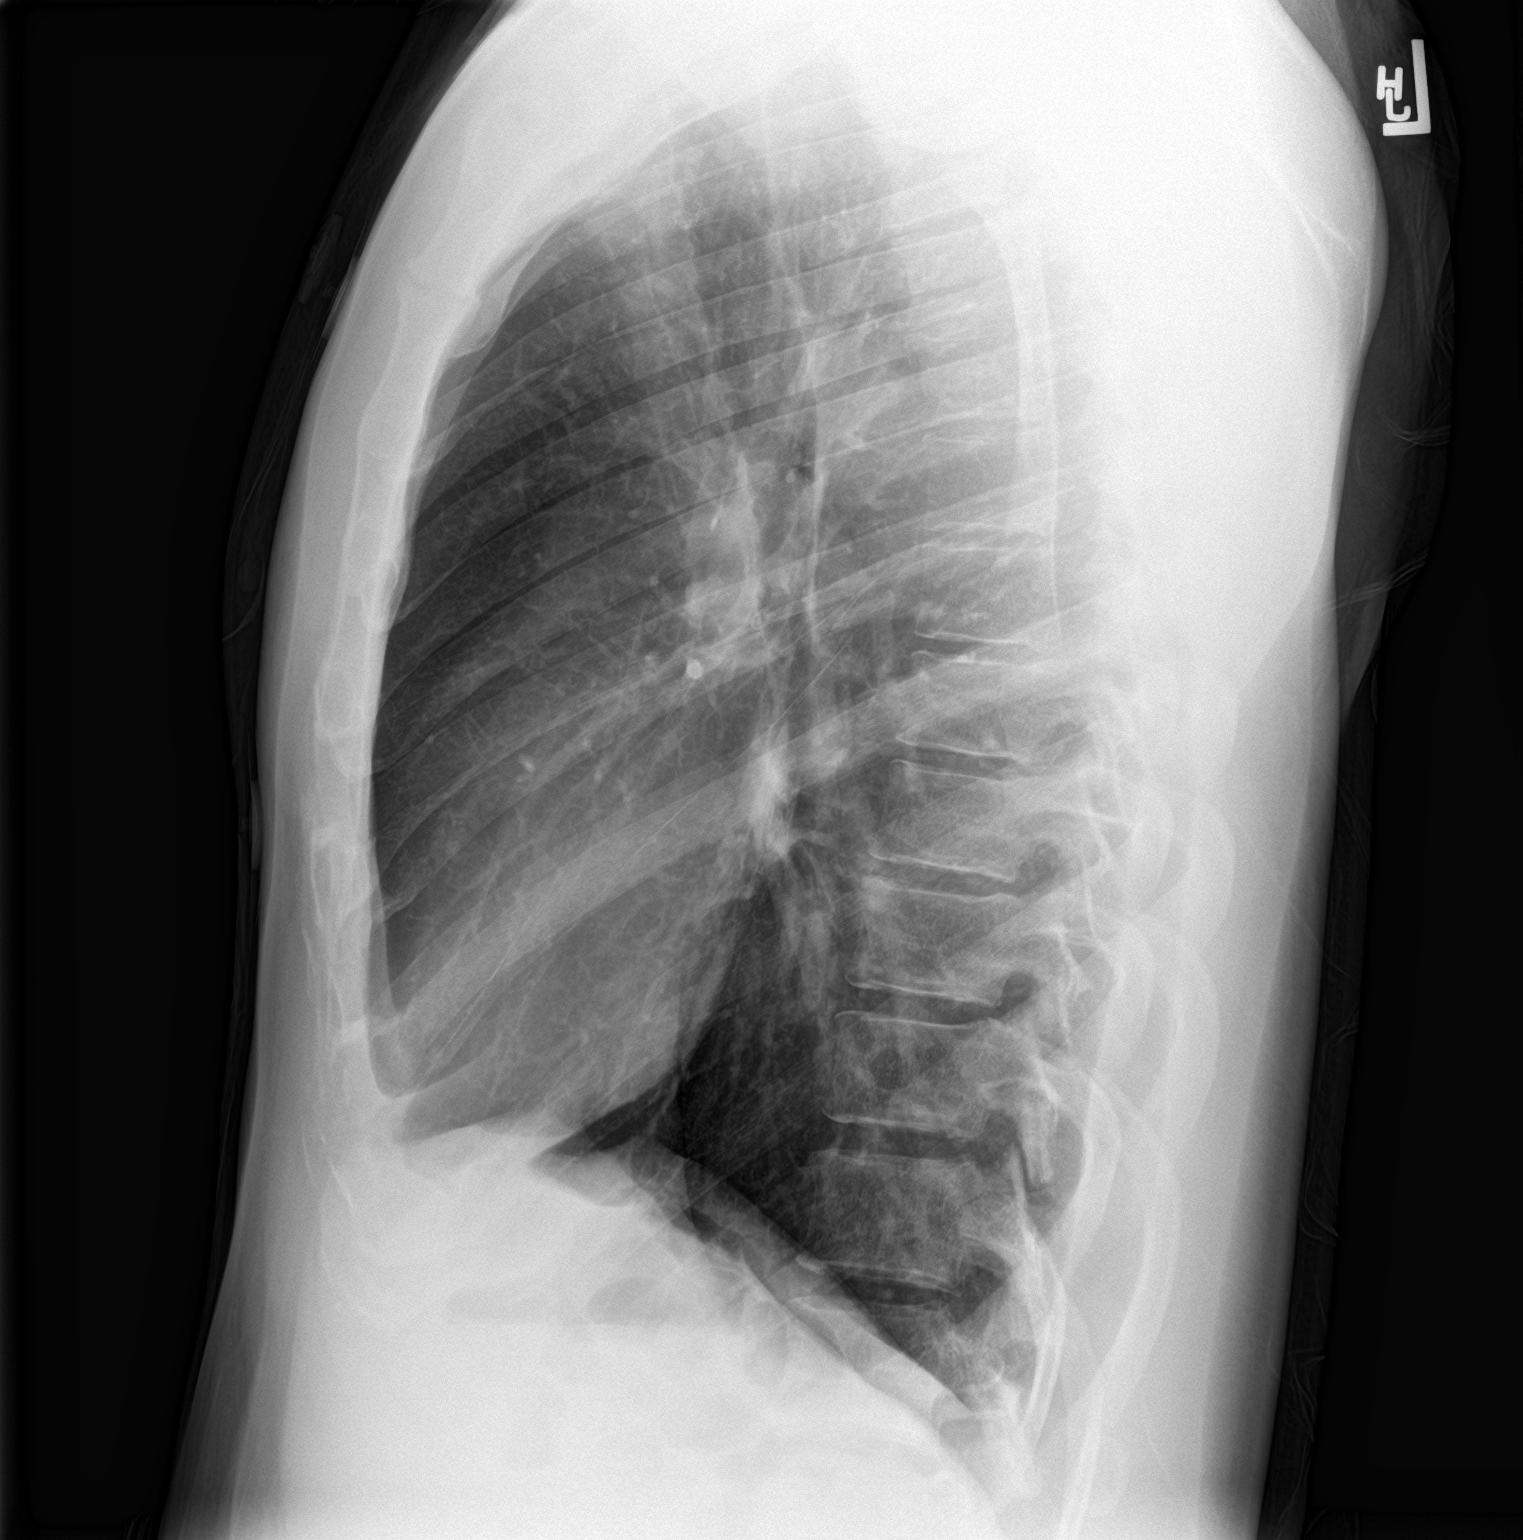

[2 of 2 positions shown; findings below may reference images not displayed]

FINDINGS: Mediastinum and hilar structures normal. Lungs are clear. No pleural
effusion or pneumothorax. Heart size normal. No acute bony
abnormality.
IMPRESSION: No acute abnormality.

## 2020-01-21 IMAGING — DX DG SHOULDER 2+V*L*
3 series · 3 of 3 positions shown · non-contrast
Comparison: None.

CLINICAL DATA: Left shoulder pain from lifting

EXAM:
LEFT SHOULDER - 2+ VIEW

[shoulder ap]
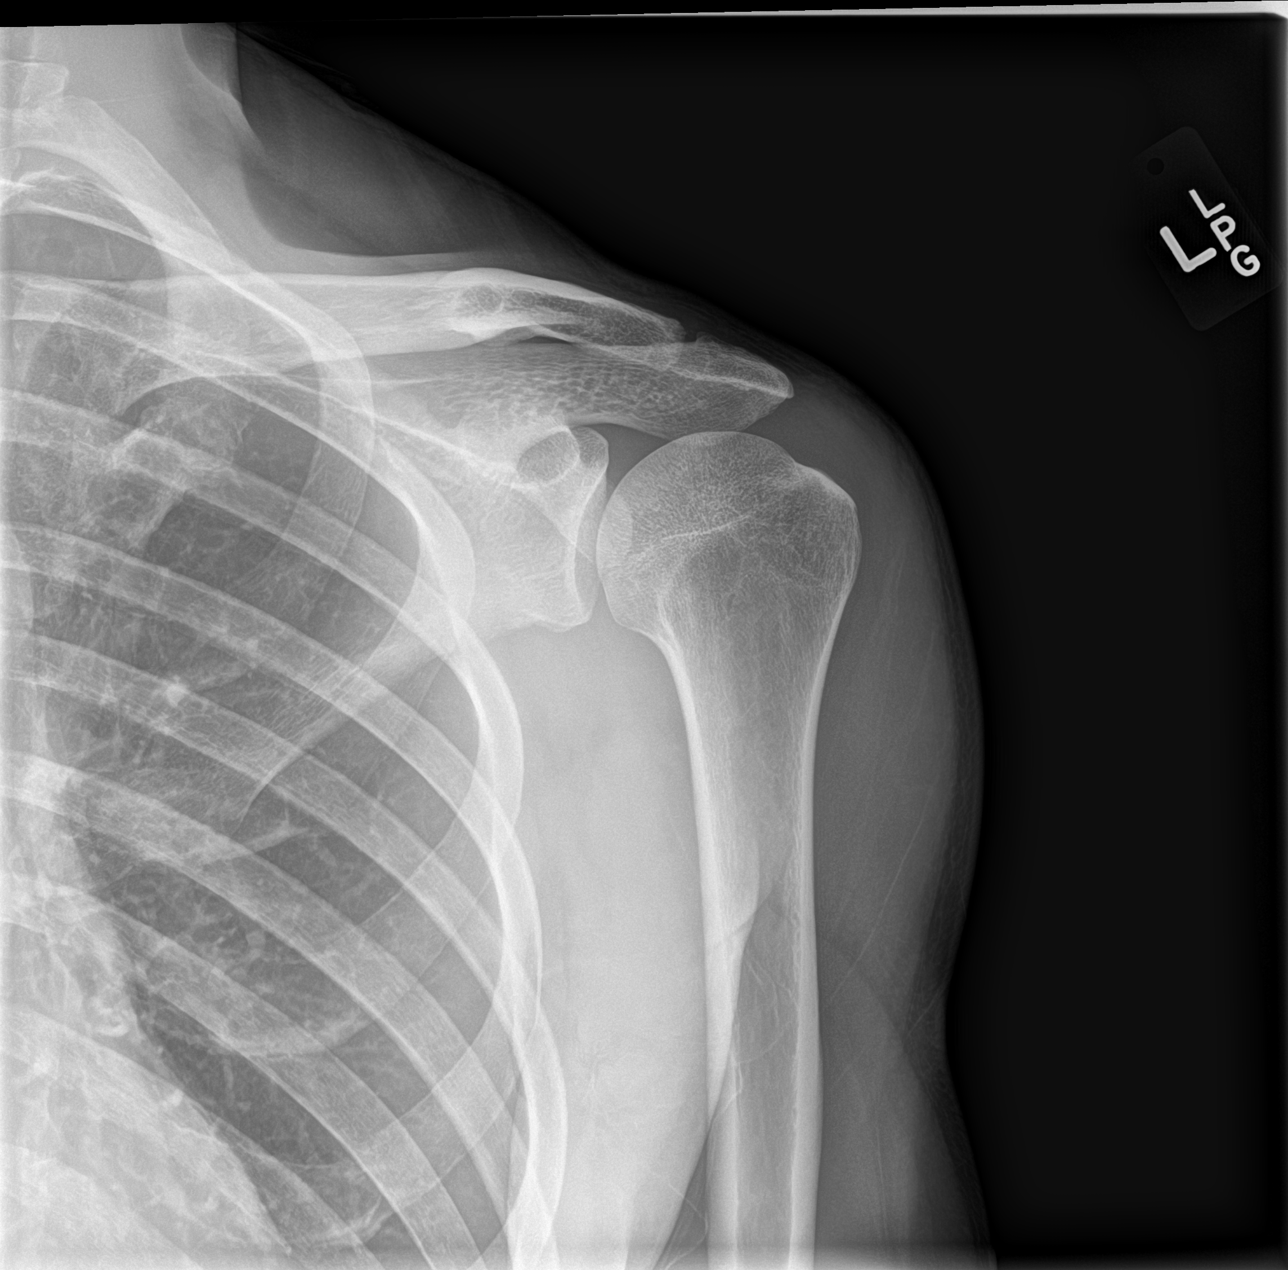

[shoulder y-view]
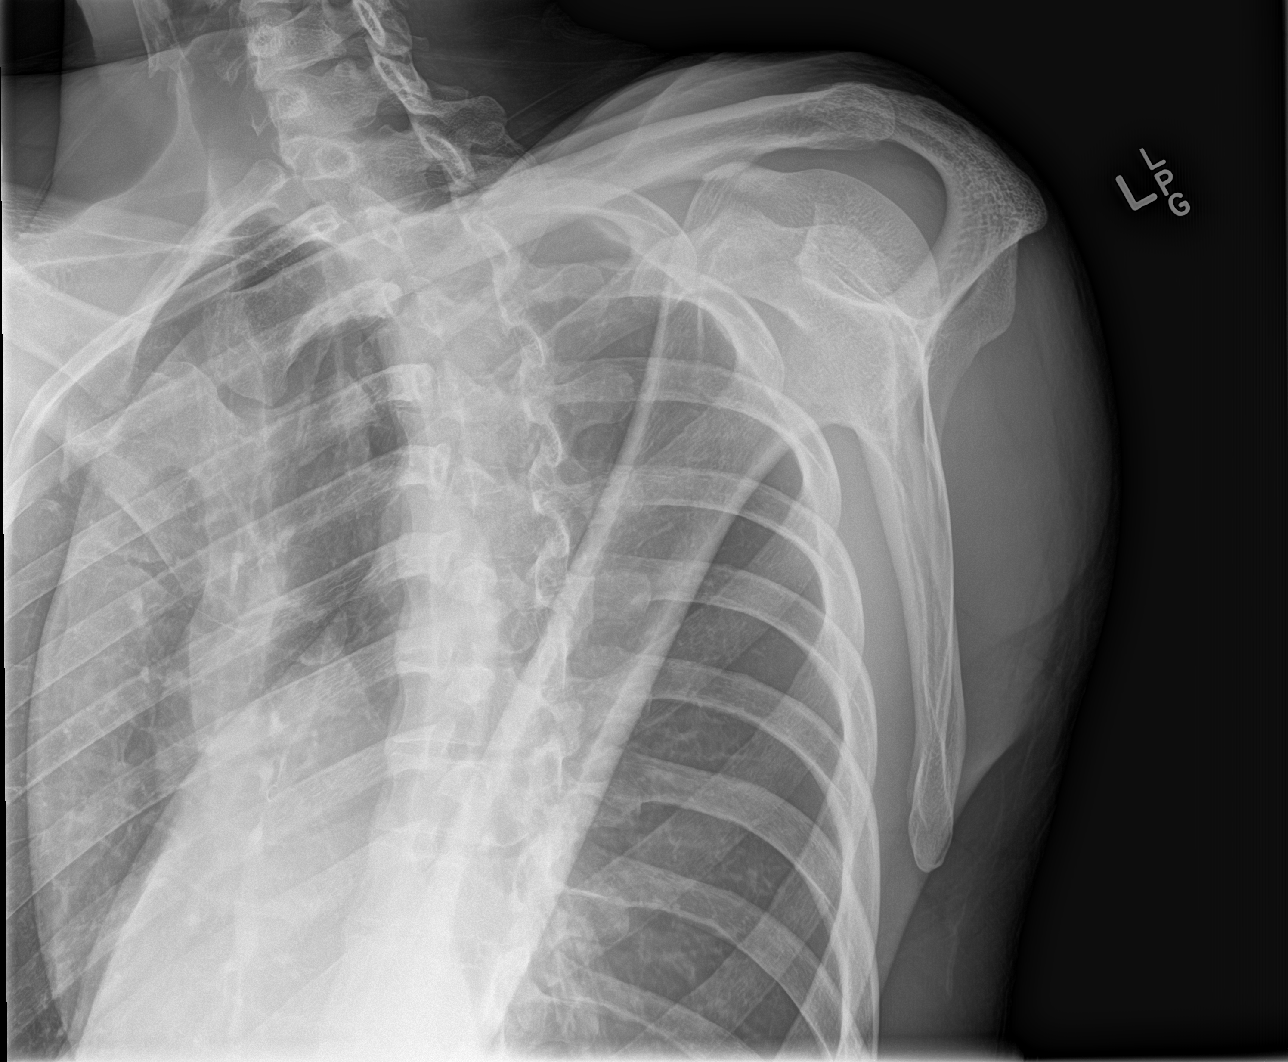

[shoulder axial]
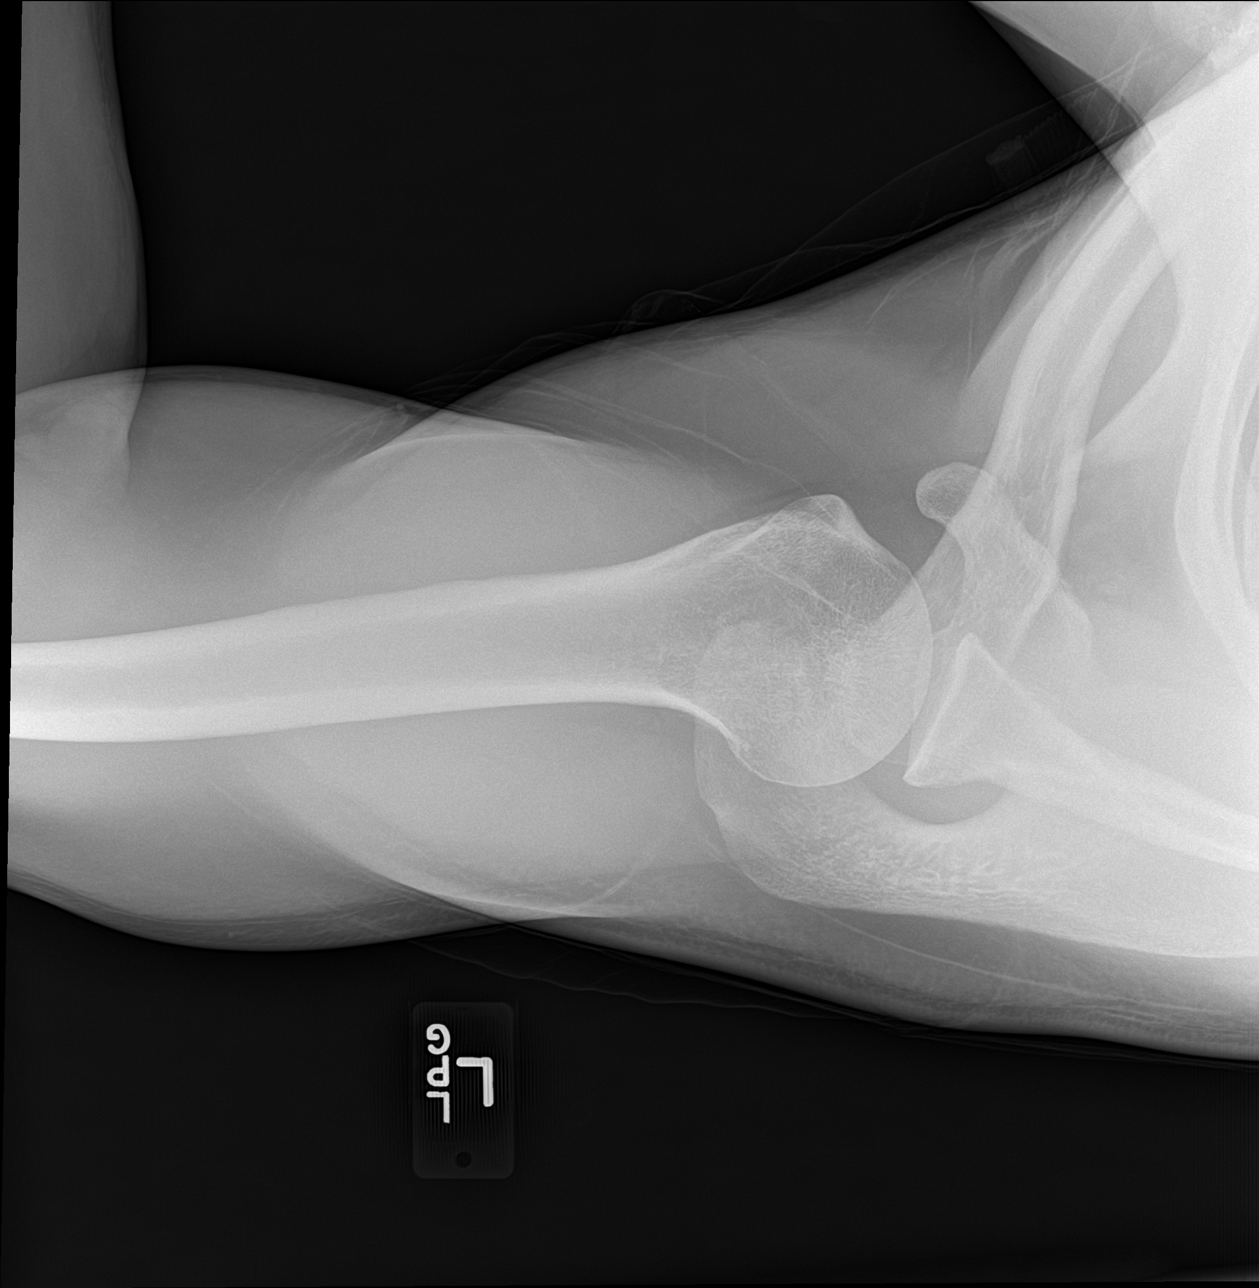

[3 of 3 positions shown; findings below may reference images not displayed]

FINDINGS: No fracture or dislocation of the left shoulder. Joint spaces are
well preserved. The partially imaged left chest is unremarkable.
IMPRESSION: No fracture or dislocation of the left shoulder. Joint spaces are
well preserved.

## 2021-10-18 ENCOUNTER — Encounter (INDEPENDENT_AMBULATORY_CARE_PROVIDER_SITE_OTHER): Payer: Self-pay
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
## 2023-04-11 NOTE — ED Notes (Signed)
 Pain assessment on discharge was 0.  Condition stable.  Patient discharged to home.  Patient education was completed:  yes  Education taught to:  patient  Teaching method used was handout.  Understanding of teaching was good.  Patient was discharged ambulatory.  Discharged with police.  Valuables were given to: patient.

## 2023-04-11 NOTE — ED Notes (Signed)
 Wound care performed at this time. Triple paste, non-adhesive bandages, and kerlex applied
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
## 2024-05-05 ENCOUNTER — Emergency Department
Admission: EM | Admit: 2024-05-05 | Discharge: 2024-05-05 | Disposition: A | Discharge status: Home / Self Care | Attending: Emergency Medicine | Admitting: Emergency Medicine

## 2024-05-05 ENCOUNTER — Emergency Department

## 2024-05-05 DIAGNOSIS — J189 Pneumonia, unspecified organism: Secondary | ICD-10-CM | POA: Insufficient documentation

## 2024-05-05 DIAGNOSIS — R079 Chest pain, unspecified: Secondary | ICD-10-CM

## 2024-05-05 DIAGNOSIS — F17210 Nicotine dependence, cigarettes, uncomplicated: Secondary | ICD-10-CM | POA: Insufficient documentation

## 2024-05-05 LAB — COMPREHENSIVE METABOLIC PANEL
ALT: 23 U/L (ref <=55)
AST (SGOT): 32 U/L (ref <=41)
Albumin/Globulin Ratio: 0.7 — ABNORMAL LOW (ref 0.9–2.2)
Albumin: 3.1 g/dL — ABNORMAL LOW (ref 3.8–5.0)
Alkaline Phosphatase: 76 U/L (ref 37–117)
Anion Gap: 15 (ref 5.0–15.0)
BUN: 20 mg/dL (ref 9–28)
Bilirubin, Total: 0.6 mg/dL (ref 0.2–1.2)
CO2: 27 meq/L (ref 17–29)
Calcium: 8.6 mg/dL (ref 8.5–10.5)
Chloride: 96 meq/L — ABNORMAL LOW (ref 99–111)
Creatinine: 0.9 mg/dL (ref 0.5–1.5)
GFR: >60 mL/min/1.73 m2 (ref >=60.0)
Globulin: 4.4 g/dL — ABNORMAL HIGH (ref 2.0–3.6)
Glucose: 114 mg/dL — ABNORMAL HIGH (ref 70–100)
Potassium: 3.7 meq/L (ref 3.5–5.3)
Protein, Total: 7.5 g/dL (ref 6.0–8.3)
Sodium: 138 meq/L (ref 135–145)

## 2024-05-05 LAB — LAB USE ONLY - CBC WITH DIFFERENTIAL
Absolute Basophils: 0.02 x10 3/uL (ref 0.00–0.08)
Absolute Eosinophils: 0.01 x10 3/uL (ref 0.00–0.44)
Absolute Immature Granulocytes: 0.04 x10 3/uL (ref 0.00–0.07)
Absolute Lymphocytes: 2.74 x10 3/uL (ref 0.42–3.22)
Absolute Monocytes: 1.14 x10 3/uL — ABNORMAL HIGH (ref 0.21–0.85)
Absolute Neutrophils: 7.48 x10 3/uL — ABNORMAL HIGH (ref 1.10–6.33)
Absolute nRBC: 0 x10 3/uL (ref <=0.00)
Basophils %: 0.2 %
Eosinophils %: 0.1 %
Hematocrit: 35.2 % — ABNORMAL LOW (ref 37.6–49.6)
Hemoglobin: 12.1 g/dL — ABNORMAL LOW (ref 12.5–17.1)
Immature Granulocytes %: 0.3 %
Lymphocytes %: 24 %
MCH: 30.8 pg (ref 25.1–33.5)
MCHC: 34.4 g/dL (ref 31.5–35.8)
MCV: 89.6 fL (ref 78.0–96.0)
MPV: 9 fL (ref 8.9–12.5)
Monocytes %: 10 %
Neutrophils %: 65.4 %
Platelet Count: 315 x10 3/uL (ref 142–346)
Preliminary Absolute Neutrophil Count: 7.48 x10 3/uL — ABNORMAL HIGH (ref 1.10–6.33)
RBC: 3.93 x10 6/uL — ABNORMAL LOW (ref 4.20–5.90)
RDW: 13 % (ref 11–15)
WBC: 11.43 x10 3/uL — ABNORMAL HIGH (ref 3.10–9.50)
nRBC %: 0 /100{WBCs} (ref <=0.0)

## 2024-05-05 LAB — LAB USE ONLY - LAVENDER - EDTA HOLD TUBE

## 2024-05-05 LAB — NT-PROBNP: NT-ProBNP: 88.2 pg/mL (ref <=180.3)

## 2024-05-05 LAB — D-DIMER: D-Dimer: 0.73 ug{FEU}/mL — ABNORMAL HIGH (ref <0.50)

## 2024-05-05 LAB — HIGH SENSITIVITY TROPONIN-I: hs Troponin: <2.7 ng/L (ref <=35.0)

## 2024-05-05 LAB — HIV-1/2, ANTIGEN AND ANTIBODY WITH REFLEX TO CONFIRMATION: HIV Ag/Ab 4th Generation: NONREACTIVE

## 2024-05-05 LAB — LAB USE ONLY - GOLD SST HOLD TUBE

## 2024-05-05 MED ORDER — SODIUM CHLORIDE 0.9 % IV SOLN
500.0000 mg | INTRAVENOUS | Status: DC
Start: 2024-05-05 — End: 2024-05-05
  Administered 2024-05-05: 500 mg via INTRAVENOUS
  Filled 2024-05-05: qty 500

## 2024-05-05 MED ORDER — STERILE WATER FOR INJECTION IJ/IV SOLN (WRAP)
1.0000 g | Freq: Once | Status: AC
Start: 2024-05-05 — End: 2024-05-05
  Administered 2024-05-05: 1 g via INTRAVENOUS
  Filled 2024-05-05: qty 1000

## 2024-05-05 MED ORDER — KETOROLAC TROMETHAMINE 30 MG/ML IJ SOLN
30.0000 mg | Freq: Once | INTRAMUSCULAR | Status: AC
Start: 2024-05-05 — End: 2024-05-05
  Administered 2024-05-05: 30 mg via INTRAVENOUS
  Filled 2024-05-05: qty 1

## 2024-05-05 MED ORDER — IOHEXOL 350 MG/ML IV SOLN
100.0000 mL | Freq: Once | INTRAVENOUS | Status: AC | PRN
Start: 2024-05-05 — End: 2024-05-05
  Administered 2024-05-05: 100 mL via INTRAVENOUS

## 2024-05-05 MED ORDER — AZITHROMYCIN 250 MG PO TABS
ORAL_TABLET | ORAL | 0 refills | Status: AC
Start: 2024-05-05 — End: 2024-05-10

## 2024-05-05 MED ORDER — AMOXICILLIN-POT CLAVULANATE 875-125 MG PO TABS
1.0000 | ORAL_TABLET | Freq: Two times a day (BID) | ORAL | 0 refills | Status: AC
Start: 2024-05-05 — End: 2024-05-12

## 2024-05-05 NOTE — Discharge Instructions (Addendum)
 Please call to follow up with Infectious Disease

## 2024-05-05 NOTE — ED Provider Notes (Signed)
 [facility]  Emergency Department Physician Note      Diagnosis/Disposition     ED Disposition:  Discharge    ED Diagnosis:  Pneumonia of right middle lobe due to infectious organism    Discharge Prescription    AMOXICILLIN-CLAVULANATE (AUGMENTIN) 875-125 MG PER TABLET    Take 1 tablet by mouth 2 (two) times daily for 7 days    AZITHROMYCIN (ZITHROMAX) 250 MG TABLET    Take 2 tablets (500 mg) by mouth once daily for 1 day, THEN 1 tablet (250 mg) once daily for 4 days.       History of Present Illness     History  Medical History[1] Past Surgical History[2] Social History[3] Family History[4] Allergies[5] Medications Ordered Prior to Encounter[6]   :[id number]}  Chief Complaint: Chest Pain     43 y.o. male with past medical history as below   History of Present Illness  Todd [name] is a 43 year old male who presents with chest pain.    Chest pain has been present for 45 minutes to an hour, progressively worsening, and is exacerbated by deep breaths. There is no radiation to the arm or neck, and no associated stomach pain. Shortness of breath is present. Recent leg swelling is noted. He smokes cigarettes. There is no history of pulmonary embolism.    Independent Historian (other than patient): Family (list in HPI)  Additional History Provided by Independent Historian:       Physical Exam     ED Triage Vitals [05/05/24 0034]   Encounter Vitals Group      BP 116/64      Girls Systolic BP Percentile       Girls Diastolic BP Percentile       Boys Systolic BP Percentile       Boys Diastolic BP Percentile       Heart Rate 84      Resp Rate 20      Temp 97.6 F (36.4 C)      Temp src Tympanic      SpO2 95 %      Weight 75 kg      Height 1.88 m      Head Circumference       Peak Flow       Pain Score 9      Pain Loc       Pain Education       Exclude from Growth Chart       Physical Exam  Constitutional:       Appearance: He is ill-appearing. He is not diaphoretic.   HENT:      Head: Normocephalic and  atraumatic.      Nose: Nose normal.      Mouth/Throat:      Mouth: Mucous membranes are moist.   Eyes:      Conjunctiva/sclera: Conjunctivae normal.   Cardiovascular:      Rate and Rhythm: Normal rate and regular rhythm.      Heart sounds: Normal heart sounds.   Pulmonary:      Effort: Pulmonary effort is normal. No respiratory distress.      Breath sounds: Normal breath sounds. No stridor. No wheezing, rhonchi or rales.   Abdominal:      General: Abdomen is flat. There is no distension.      Palpations: Abdomen is soft.      Tenderness: There is no abdominal tenderness. There is no guarding or rebound.   Musculoskeletal:  Cervical back: Neck supple. No rigidity.      Right lower leg: No edema.      Left lower leg: No edema.   Skin:     General: Skin is warm.      Capillary Refill: Capillary refill takes 2 to 3 seconds.   Neurological:      General: No focal deficit present.      Mental Status: He is alert.   Psychiatric:         Mood and Affect: Mood normal.                 Medical Decision Making      PRIMARY PROBLEM LIST      1. Acute illness/injury DIAGNOSIS:pneumonia   Chronic Illness Impacting Care of the above problem: Other (explain) TIAIncreases the risk of severe disease   Differential Diagnosis: Chest pain non traumatic:  AMI/ACS, PE, Dissection, GI, muscular, PTX     DISCUSSION          ED Course as of 05/05/24 0324   Sun May 05, 2024   0055 WBC(!): 11.43 [ML]   0056 Hemoglobin(!): 12.1  anemia [ML]   0115 D-Dimer(!): 0.73  Will obtain CT angiogram [ML]   0126 Creatinine: 0.9  Normal renal function with no significant electrolyte abnormality.  [ML]   0134 NT-proBNP: 88.2 [ML]   0134 hs Troponin-I: <2.7  Unlikely ACS [ML]   0228 Possible MAI seen on CT imaging vs other severe atypical pneumonia. Added on further test including sputum culture. [ML]   0232 CT Angiogram Pulmonary  Severe atypical infection [ML]   0247 Will be starting antibiotics including azithromycin. Patient will need close follow up  with infectious disease, placed referral. Per UTD, can hold off rifampin/ethambutol pending sputum culture. [ML]   [phone] Discussed results with patient, has been feeling unwell with cough recently.  [ML]      ED Course User Index  [ML] Avin Upperman, DO       The patient was deemed stable for discharge. They were given strict return precautions as it relates to their presumed diagnosis, verbalized understanding of these precautions and agreed to follow up as instructed. All questions were answered prior to discharge.    Additional Notes               External Records Reviewed?: Physician Office Records                       Vital Signs: Reviewed the patient's vital signs.   Nursing Notes: Reviewed and utilized available nursing notes.   Medical Records Reviewed: Reviewed available past medical records.   Counseling: The emergency provider has spoken with the patient and discussed today's findings, in addition to providing specific details for the plan of care. Questions are answered and there is agreement with the plan.     CRITICAL CARE/PROCEDURES    Procedures        CARDIAC STUDIES      The following cardiac studies were independently interpreted by me the Emergency Medicine Provider. For full cardiac study results please see chart.   Monitor Strip interpreted by me (ED provider)   Rate: 60-100   Rhythm: Normal Sinus Rhythm     EKG 1 interpreted by me (ED provider)        Rate: 60-100   Rhythm: Normal Sinus Rhythm   ST segments: No acute changes   STEMI?: No   EKG interpretation: Nonspecific  EMERGENCY IMAGING STUDIES    The following imagine studies were independently interpreted by me (emergency medicine provider):  The following imagine studies were independently interpreted by me (emergency medicine provider):                  CT Chest Interpreted by me (ED Provider)      RESULT: Other (explain) pneumonia  IMPRESSION: Other (describe)            Supplemental Encounter Data    Medical History[7]  Past Surgical History[8]  Social History[9]  Family History[10]  Allergies[11]    Encounter Orders:  Orders Placed This Encounter   Procedures    Culture, Sputum and Lower Respiratory    CT Angiogram Pulmonary    CBC with Differential (Order)    D-Dimer    Comprehensive Metabolic Panel    High Sensitivity Troponin-I at 0 hrs    NT-ProBNP    CBC with Differential (Component)    HIV-1/2, Antigen and Antibody with Reflex to Confirmation    Lavender - EDTA Hold Tube    Gold SST Hold Tube    Ambulatory referral to Infectious Disease    ECG 12 Lead     Medications Administered:  Medications   azithromycin (ZITHROMAX) 500 mg in sodium chloride 0.9 % 250 mL IVPB (500 mg Intravenous New Bag 05/05/24 0249)   ketorolac  (TORADOL ) injection 30 mg (30 mg Intravenous Given 05/05/24 0054)   iohexol (OMNIPAQUE) 350 MG/ML injection 100 mL (100 mLs Intravenous Imaging Agent Given 05/05/24 0210)   cefTRIAXone (ROCEPHIN) 1 g in sterile water (preservative free) 10 mL IV push injection (1 g Intravenous Given 05/05/24 0244)     Laboratory and Imaging Studies:  Results for orders placed or performed during the hospital encounter of 05/05/24 (from the past 24 hours)    Collection Time: 05/05/24 12:42 AM   Result Value    D-Dimer 0.73 (H)   Comprehensive Metabolic Panel    Collection Time: 05/05/24 12:42 AM   Result Value    Glucose 114 (H)    BUN 20    Creatinine 0.9    Sodium 138    Potassium 3.7    Chloride 96 (L)    CO2 27    Calcium  8.6    Anion Gap 15.0    GFR >60.0    AST (SGOT) 32    ALT 23    Alkaline Phosphatase 76    Albumin 3.1 (L)    Protein, Total 7.5    Globulin 4.4 (H)    Albumin/Globulin Ratio 0.7 (L)    Bilirubin, Total 0.6   High Sensitivity Troponin-I at 0 hrs    Collection Time: 05/05/24 12:42 AM   Result Value    hs Troponin <2.7    Collection Time: 05/05/24 12:42 AM   Result Value    NT-ProBNP 88.2   CBC with Differential (Component)    Collection Time: 05/05/24 12:42 AM   Result Value    WBC  11.43 (H)    Hemoglobin 12.1 (L)    Hematocrit 35.2 (L)    Platelet Count 315    MPV 9.0    RBC 3.93 (L)    MCV 89.6    MCH 30.8    MCHC 34.4    RDW 13    nRBC % 0.0    Absolute nRBC 0.00    Preliminary Absolute Neutrophil Count 7.48 (H)    Neutrophils % 65.4    Lymphocytes % 24.0    Monocytes %  10.0    Eosinophils % 0.1    Basophils % 0.2    Immature Granulocytes % 0.3    Absolute Neutrophils 7.48 (H)    Absolute Lymphocytes 2.74    Absolute Monocytes 1.14 (H)    Absolute Eosinophils 0.01    Absolute Basophils 0.02    Absolute Immature Granulocytes 0.04     CT Angiogram Pulmonary   Final Result      Multilobar findings of severe atypical infection including MAI. Recommend   follow-up CT imaging to ensure resolution.      Mediastinal and hilar lymphadenopathy. Recommend attention on follow-up CT   imaging or further evaluation with PET/CT.      No pulmonary embolism.          Yancy DOROTHA Revering, MD   05/05/2024 2:25 AM                     [1]   Past Medical History:  Diagnosis Date    History of migraine     Hx-TIA (transient ischemic attack)     Tobacco abuse    [2] No past surgical history on file.  [3]   Social History  Tobacco Use    Smoking status: Every Day     Current packs/day: 1.50     Types: Cigarettes   Substance Use Topics    Alcohol use: Yes    Drug use: No   [4] No family history on file.  [5] No Known Allergies  [6]   No current facility-administered medications on file prior to encounter.     Current Outpatient Medications on File Prior to Encounter   Medication Sig Dispense Refill    aspirin  EC 81 MG EC tablet Take 1 tablet (81 mg total) by mouth daily. 30 tablet 1    butalbital -aspirin -caffeine  (FIORINAL) 50-325-40 MG per capsule Take 1-2 capsules by mouth every 6 (six) hours as needed for Headaches. 25 capsule 1   [7]   Past Medical History:  Diagnosis Date    History of migraine     Hx-TIA (transient ischemic attack)     Tobacco abuse    [8] No past surgical history on file.  [9]   Social  History  Tobacco Use    Smoking status: Every Day     Current packs/day: 1.50     Types: Cigarettes   Substance Use Topics    Alcohol use: Yes    Drug use: No   [10] No family history on file.  [11] No Known Allergies       Todd Mcquitty, DO  05/05/24 [id number]

## 2024-05-05 NOTE — ED Notes (Signed)
 Discharge orders made by provider,  Discussed discharge plan with patient, patient verbalized understanding advised follow up as needed, advised to come back to ED if symptoms persist for re-evaluation. Left ED in stable condition, patient alert and oriented X 4. No further complaints made.

## 2024-05-07 LAB — ECG 12-LEAD
Atrial Rate: 89 {beats}/min
P Axis: 80 degrees
P-R Interval: 134 ms
Q-T Interval: 352 ms
QRS Duration: 84 ms
QTC Calculation (Bezet): 428 ms
R Axis: 11 degrees
T Axis: 63 degrees
Ventricular Rate: 89 {beats}/min

## 2024-07-09 ENCOUNTER — Emergency Department
Admission: EM | Admit: 2024-07-09 | Discharge: 2024-07-09 | Disposition: A | Discharge status: Home / Self Care | Attending: Emergency Medicine | Admitting: Emergency Medicine

## 2024-07-09 ENCOUNTER — Emergency Department

## 2024-07-09 DIAGNOSIS — T7840XA Allergy, unspecified, initial encounter: Secondary | ICD-10-CM | POA: Insufficient documentation

## 2024-07-09 DIAGNOSIS — R0789 Other chest pain: Secondary | ICD-10-CM | POA: Insufficient documentation

## 2024-07-09 DIAGNOSIS — F1490 Cocaine use, unspecified, uncomplicated: Secondary | ICD-10-CM | POA: Insufficient documentation

## 2024-07-09 DIAGNOSIS — R079 Chest pain, unspecified: Secondary | ICD-10-CM

## 2024-07-09 DIAGNOSIS — F17210 Nicotine dependence, cigarettes, uncomplicated: Secondary | ICD-10-CM | POA: Insufficient documentation

## 2024-07-09 DIAGNOSIS — X58XXXA Exposure to other specified factors, initial encounter: Secondary | ICD-10-CM | POA: Insufficient documentation

## 2024-07-09 DIAGNOSIS — Z9081 Acquired absence of spleen: Secondary | ICD-10-CM | POA: Insufficient documentation

## 2024-07-09 DIAGNOSIS — F1990 Other psychoactive substance use, unspecified, uncomplicated: Secondary | ICD-10-CM

## 2024-07-09 LAB — URINE DRUGS OF ABUSE SCREEN
Urine Amphetamine Screen: NEGATIVE
Urine Barbituate Screen: NEGATIVE
Urine Benzodiazepine Screen: NEGATIVE
Urine Cannabinoid Screen: NEGATIVE
Urine Cocaine Screen: POSITIVE — AB
Urine Fentanyl Screen: POSITIVE — AB
Urine Opiate Screen: NEGATIVE
Urine PCP Screen: NEGATIVE

## 2024-07-09 LAB — LAB USE ONLY - CBC WITH DIFFERENTIAL
Absolute Basophils: 0.03 x10 3/uL (ref 0.00–0.08)
Absolute Eosinophils: 0.1 x10 3/uL (ref 0.00–0.44)
Absolute Immature Granulocytes: 0.02 x10 3/uL (ref 0.00–0.07)
Absolute Lymphocytes: 3.18 x10 3/uL (ref 0.42–3.22)
Absolute Monocytes: 0.93 x10 3/uL — ABNORMAL HIGH (ref 0.21–0.85)
Absolute Neutrophils: 6.1 x10 3/uL (ref 1.10–6.33)
Absolute nRBC: 0 x10 3/uL (ref <=0.00)
Basophils %: 0.3 %
Eosinophils %: 1 %
Hematocrit: 34.4 % — ABNORMAL LOW (ref 37.6–49.6)
Hemoglobin: 11.4 g/dL — ABNORMAL LOW (ref 12.5–17.1)
Immature Granulocytes %: 0.2 %
Lymphocytes %: 30.7 %
MCH: 30.1 pg (ref 25.1–33.5)
MCHC: 33.1 g/dL (ref 31.5–35.8)
MCV: 90.8 fL (ref 78.0–96.0)
MPV: 9.2 fL (ref 8.9–12.5)
Monocytes %: 9 %
Neutrophils %: 58.8 %
Platelet Count: 227 x10 3/uL (ref 142–346)
Preliminary Absolute Neutrophil Count: 6.1 x10 3/uL (ref 1.10–6.33)
RBC: 3.79 x10 6/uL — ABNORMAL LOW (ref 4.20–5.90)
RDW: 14 % (ref 11–15)
WBC: 10.36 x10 3/uL — ABNORMAL HIGH (ref 3.10–9.50)
nRBC %: 0 /100{WBCs} (ref <=0.0)

## 2024-07-09 LAB — MAGNESIUM: Magnesium: 1.8 mg/dL (ref 1.6–2.6)

## 2024-07-09 LAB — COMPREHENSIVE METABOLIC PANEL
ALT: 25 U/L (ref <=55)
AST (SGOT): 26 U/L (ref <=41)
Albumin/Globulin Ratio: 1 (ref 0.9–2.2)
Albumin: 3.4 g/dL — ABNORMAL LOW (ref 3.8–5.0)
Alkaline Phosphatase: 80 U/L (ref 37–117)
Anion Gap: 10 (ref 5.0–15.0)
BUN: 17 mg/dL (ref 9–28)
Bilirubin, Total: 0.4 mg/dL (ref 0.2–1.2)
CO2: 27 meq/L (ref 17–29)
Calcium: 8.9 mg/dL (ref 8.5–10.5)
Chloride: 104 meq/L (ref 99–111)
Creatinine: 1 mg/dL (ref 0.5–1.5)
GFR: >60 mL/min/1.73 m2 (ref >=60.0)
Globulin: 3.4 g/dL (ref 2.0–3.6)
Glucose: 90 mg/dL (ref 70–100)
Potassium: 4 meq/L (ref 3.5–5.3)
Protein, Total: 6.8 g/dL (ref 6.0–8.3)
Sodium: 141 meq/L (ref 135–145)

## 2024-07-09 LAB — HIGH SENSITIVITY TROPONIN-I WITH DELTA: hs Troponin: <2.7 ng/L (ref <=35.0)

## 2024-07-09 LAB — HIGH SENSITIVITY TROPONIN-I: hs Troponin: <2.7 ng/L (ref <=35.0)

## 2024-07-09 MED ORDER — EPINEPHRINE 0.3 MG/0.3ML IJ SOAJ: 0.3000 mg | INTRAMUSCULAR | 0 refills | Status: AC | PRN

## 2024-07-09 MED ORDER — NALOXONE HCL 2 MG/2ML IJ SOSY
1.0000 mg | PREFILLED_SYRINGE | Freq: Once | INTRAMUSCULAR | Status: AC | PRN
  Administered 2024-07-09: 1 mg via INTRAVENOUS
  Filled 2024-07-09: qty 2

## 2024-07-09 MED ORDER — NALOXONE RESCUE KIT TO GO
1.0000 | PACK | Freq: Once | Status: AC
  Administered 2024-07-09: 1
  Filled 2024-07-09: qty 1

## 2024-07-09 MED ORDER — FAMOTIDINE 10 MG/ML IV SOLN (WRAP)
20.0000 mg | Freq: Once | INTRAVENOUS | Status: AC
  Administered 2024-07-09: 20 mg via INTRAVENOUS

## 2024-07-09 MED ORDER — METHYLPREDNISOLONE 4 MG PO TBPK: ORAL_TABLET | ORAL | 0 refills | Status: AC

## 2024-07-09 MED ORDER — DEXAMETHASONE SODIUM PHOSPHATE 10 MG/ML IJ SOLN
10.0000 mg | Freq: Once | INTRAMUSCULAR | Status: AC
  Administered 2024-07-09: 10 mg via INTRAVENOUS
  Filled 2024-07-09: qty 1

## 2024-07-09 NOTE — ED Provider Notes (Signed)
 [facility]  Emergency Department Physician Note      Diagnosis/Disposition     ED Disposition:  Discharge from ED Observation    ED Diagnosis:     Allergic reaction, initial encounter  Cocaine use  Chest discomfort  Polysubstance use disorder    Discharge Medication List as of 07/09/2024  5:48 AM        START taking these medications    Details   EPINEPHrine 0.3 MG/0.3ML auto-injector Inject 0.3 mLs (0.3 mg) into the muscle as needed for Anaphylaxis (for Anaphylaxis), Starting Tue 07/09/2024, E-Rx      methylPREDNISolone (MEDROL DOSEPAK) 4 MG tablet Use as directed, Normal             History of Present Illness      Chief Complaint: Allergic Reaction     This is a 43 year old male with 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  History obtained by EMS as well as patient.  Approximately 2 hours prior to presentation to the emergency department, patient began having facial swelling.  He does admit to using a new lotion today.  Patient states that approximately 1 hour prior to presentation to the emergency department he used cocaine.  He states that he snorted cocaine.  Does admit to chest discomfort since snorting cocaine.  Denies any other exposure, drug use, prior episodes such as this, difficulty breathing, throat swelling, nausea, vomiting, new or old medications taken.  EMS gave patient 50 mg of IV Benadryl  prior to presentation to the emergency department.  Per EMS report, patient had tongue swelling but patient denies any tongue swelling and no evidence of tongue swelling on examination    PMH: See HPI  PSH: See nursing notes  Allergies: Denies   Social: Admits to cigarette and cocaine use, denies alcohol, marijuana, IVDA          Physical Exam     ED Triage Vitals [07/09/24 0234]   Encounter Vitals Group      BP 128/84      Girls Systolic BP Percentile       Girls Diastolic BP Percentile       Boys Systolic BP Percentile       Boys Diastolic BP  Percentile       Heart Rate 60      Resp Rate 20      Temp 98 F (36.7 C)      Temp src Oral      SpO2 95 %      Weight 79.1 kg      Height       Head Circumference       Peak Flow       Pain Score 0      Pain Loc       Pain Education       Exclude from Growth Chart       Physical Exam  Vitals and nursing note reviewed.   Constitutional:       Appearance: He is not ill-appearing or toxic-appearing.   HENT:      Head: Atraumatic.      Mouth/Throat:      Mouth: Mucous membranes are moist.      Tongue: No lesions. Tongue does not deviate from midline.      Pharynx: Oropharynx is clear. Posterior oropharyngeal erythema present. No pharyngeal swelling, oropharyngeal exudate or uvula swelling.      Comments: No evidence of tongue swelling  Eyes:      Extraocular Movements: Extraocular movements intact.      Pupils: Pupils are equal, round, and reactive to light.      Comments: Bilateral periorbital allergic edema   Cardiovascular:      Rate and Rhythm: Normal rate and regular rhythm.      Pulses: Normal pulses.   Pulmonary:      Effort: Pulmonary effort is normal.      Breath sounds: Normal breath sounds.   Abdominal:      Tenderness: There is no abdominal tenderness.   Musculoskeletal:         General: Normal range of motion.      Cervical back: Normal range of motion.   Skin:     General: Skin is warm.   Neurological:      General: No focal deficit present.      Mental Status: He is alert.      Comments: Drowsy but arousable          Medical Decision Making        PRIMARY PROBLEM LIST      Medical Decision Making  This is a 43 year old male with past medical history of splenectomy and cocaine use who presents for facial swelling.  At time presentation, patient's vital signs are stable and within normal limits.  Evidence of allergic reaction with periorbital edema.  No evidence of tongue swelling or throat swelling at this time and no reported difficulty breathing.  Significant bilateral periorbital edema and will wash  face as well as give remainder of allergy cocktail including Decadron as well as Pepcid.  Patient received Benadryl  with EMS.  Due to patient's recent use of cocaine, will hold on epinephrine at this time and if worsens, will give epinephrine but no large evidence of anaphylaxis at this time with more periorbital edema than oral edema.  Cardiac workup also initiated given recent cocaine use with reported chest discomfort    Pulse ox reviewed and is normal at this time on room air.  Patient's old records were reviewed.    Amount and/or Complexity of Data Reviewed  Labs: ordered. Decision-making details documented in ED Course.  Radiology: ordered.  ECG/medicine tests: ordered.    Risk  Prescription drug management.          DISCUSSION        The patient was deemed stable for discharge. They were given strict return precautions as it relates to their presumed diagnosis, verbalized understanding of these precautions and agreed to follow up as instructed. All questions were answered prior to discharge.    Additional Notes                                   ED Course as of 07/09/24 2309   Tue Jul 09, 2024   0253 Patient's EKG interpreted by myself and computer demonstrated normal sinus rhythm at a ventricular rate of 64, otherwise normal axis, normal intervals and no new ST changes [AD]   0312 Reeval- mild improvement on reexamination [AD]   0312 WBC(!): 10.36 [AD]   0312 Hemoglobin(!): 11.4 [AD]   0312 Hematocrit(!): 34.4 [AD]   0312 Platelet Count: 227 [AD]   0333 Able to open right eye and improved periorbital edema bilaterally after face washed [AD]   0344 Magnesium: 1.8 [AD]   0344 hs Troponin-I: <2.7 [AD]   0344 BUN: 17 [AD]   0344 Creatinine:  1.0 [AD]   0344 Sodium: 141 [AD]   0344 Potassium: 4.0 [AD]   0344 Chloride: 104 [AD]   0344 AST: 26 [AD]   0344 ALT: 25 [AD]   0344 Alkaline Phosphatase: 80 [AD]   0410 Chest x-ray interpreted by myself demonstrated no acute cardiopulmonary process [AD]   0422 Chest  xray  IMPRESSION:         No definite acute abnormality. The lungs are well-expanded and clear.   [AD]   0517 Cocaine, UR(!): Positive [AD]   0517 Urine Fentanyl(!): Positive  Narcan ordered PRN for drowsiness [AD]   0547 hs Troponin-I: <2.7 [AD]   0602 Upon evaluation, patient was sleeping and arousable but intermittently cooperative.  His pupils were midsize and reactive.  Facial swelling had improved while in emergency department and face had been washed multiple times.  Patient was updated on results and plan for discharge with follow-up.  EpiPen and Medrol Dosepak sent to patient's pharmacy due to persistent mild swelling.  He was advised on polysubstance abuse cessation.  Given resources for rehab.  Was able to tolerate p.o. intake while I was in the room.  Patient also had ambulated prior to my final reevaluation with p.o. challenge and he had urinated on the floor.  On time of discharge, patient was in no worsening distress, ambulating without difficulty and tolerating oral intake.  Patient discharged with prescriptions [AD]      ED Course User Index  [AD] Arrion Broaddus, Jon PARAS, MD      Heart Score      Flowsheet Row Value   History 0   EKG 0   Risk Factors 0   Total (with age) 0   Onset of pain (time of START of last episode of chest pain)? 0-3 hrs ago        Vital Signs: Reviewed the patient's vital signs.   Nursing Notes: Reviewed and utilized available nursing notes.   Medical Records Reviewed: Reviewed available past medical records.   Counseling: The emergency provider has spoken with the patient and discussed today's findings, in addition to providing specific details for the plan of care. Questions are answered and there is agreement with the plan.     CRITICAL CARE/PROCEDURES    Procedures   The services I provided to this patient were to treat and/or prevent clinically significant deterioration that could result in the failure of one or more body systems and/or organs.    Services included the  following: chart data review, reviewing nursing notes and/or old charts, documentation time, consultant collaboration regarding findings and treatment options, medication orders and management, direct patient care, re-evaluations, vital sign assessments and ordering, interpreting and reviewing diagnostic studies/lab tests.    Aggregate critical care time was 43 minutes, which includes only time during which I was engaged in work directly related to the patient's care, as described above, whether at the bedside or elsewhere in the Emergency Department.  It did not include time spent performing other reported procedures or the services of residents, students, nurses or physician assistants        CARDIAC STUDIES      The following cardiac studies were independently interpreted by me the Emergency Medicine Provider. For full cardiac study results please see chart.  EMERGENCY IMAGING STUDIES      The following imaging studies were independently interpreted by me (emergency medicine provider):                                       Supplemental Encounter Data   Medical History[7]  Past Surgical History[8]  Social History[9]  Family History[10]  Allergies[11]    Medications Administered:  Medications   dexAMETHasone (DECADRON) injection 10 mg (10 mg Intravenous Given 07/09/24 0241)   famotidine (PEPCID) injection 20 mg (20 mg Intravenous Given 07/09/24 0241)   naloxone (NARCAN) injection 1 mg (1 mg Intravenous Given 07/09/24 0531)   naloxone nasal spray rescue kit - ED dispense to home (1 kit Does not apply Meds to Go - ED Use Only 07/09/24 0619)     Laboratory and Imaging Studies:  Results for orders placed or performed during the hospital encounter of 07/09/24 (from the past 24 hours)   Comprehensive Metabolic Panel    Collection Time: 07/09/24  2:47 AM   Result Value    Glucose 90    BUN 17    Creatinine 1.0    Sodium 141    Potassium 4.0    Chloride 104     CO2 27    Calcium  8.9    Anion Gap 10.0    GFR >60.0    AST (SGOT) 26    ALT 25    Alkaline Phosphatase 80    Albumin 3.4 (L)    Protein, Total 6.8    Globulin 3.4    Albumin/Globulin Ratio 1.0    Bilirubin, Total 0.4    Collection Time: 07/09/24  2:47 AM   Result Value    Magnesium 1.8   High Sensitivity Troponin-I at 0 hrs    Collection Time: 07/09/24  2:47 AM   Result Value    hs Troponin <2.7   CBC with Differential (Component)    Collection Time: 07/09/24  2:47 AM   Result Value    WBC 10.36 (H)    Hemoglobin 11.4 (L)    Hematocrit 34.4 (L)    Platelet Count 227    MPV 9.2    RBC 3.79 (L)    MCV 90.8    MCH 30.1    MCHC 33.1    RDW 14    nRBC % 0.0    Absolute nRBC 0.00    Preliminary Absolute Neutrophil Count 6.10    Neutrophils % 58.8    Lymphocytes % 30.7    Monocytes % 9.0    Eosinophils % 1.0    Basophils % 0.3    Immature Granulocytes % 0.2    Absolute Neutrophils 6.10    Absolute Lymphocytes 3.18    Absolute Monocytes 0.93 (H)    Absolute Eosinophils 0.10    Absolute Basophils 0.03    Absolute Immature Granulocytes 0.02   Urine Drugs of Abuse Screen    Collection Time: 07/09/24  4:00 AM   Result Value    Urine Amphetamine Screen Negative    Urine Barbituate Screen Negative    Urine Benzodiazepine Screen Negative    Urine Cannabinoid Screen Negative    Urine Cocaine Screen Positive (A)    Urine Fentanyl Screen Positive (A)    Urine Opiate Screen Negative    Urine PCP Screen Negative   High Sensitivity Troponin-I at 2 hrs with calculated Delta    Collection Time: 07/09/24  4:56 AM   Result Value    hs Troponin <2.7    hs Troponin-I Delta      XR Chest  AP Portable   Final Result          No definite acute abnormality. The lungs are well-expanded and clear.      Austin Door, MD   07/09/2024 3:55 AM                     [7]   Past Medical History:  Diagnosis Date    History of migraine     Hx-TIA (transient ischemic attack)     Tobacco abuse    [8] History reviewed. No pertinent surgical history.  [9]   Social  History  Tobacco Use    Smoking status: Every Day     Current packs/day: 1.50     Types: Cigarettes   Substance Use Topics    Alcohol use: Yes    Drug use: Yes     Comment: Patient took cocaine 1 hour ago as claimed.   [10] No family history on file.  [11] No Known Allergies       Javana Schey J, MD  07/09/24 2309

## 2024-07-09 NOTE — ED Notes (Signed)
 [place] MD advised that the patient is safe to go home now. RN acknowledged.

## 2024-07-09 NOTE — ED Notes (Signed)
Bed: E14  Expected date:   Expected time:   Means of arrival:   Comments:  Medic 424

## 2024-07-10 LAB — ECG 12-LEAD
Atrial Rate: 64 {beats}/min
IHS MUSE NARRATIVE AND IMPRESSION: NORMAL
P Axis: 42 degrees
P-R Interval: 128 ms
Q-T Interval: 378 ms
QRS Duration: 86 ms
QTC Calculation (Bezet): 389 ms
R Axis: 56 degrees
T Axis: 69 degrees
Ventricular Rate: 64 {beats}/min
